# Patient Record
Sex: Female | Born: 1965 | Race: Black or African American | Hispanic: No | Marital: Single | Smoking: Never smoker
Health system: Southern US, Community
[De-identification: ages and names within clinical notes are randomized; demographics above are authoritative.]

## PROBLEM LIST (undated history)

## (undated) DIAGNOSIS — F78 Other intellectual disabilities: Secondary | ICD-10-CM

## (undated) DIAGNOSIS — I10 Essential (primary) hypertension: Secondary | ICD-10-CM

## (undated) DIAGNOSIS — J40 Bronchitis, not specified as acute or chronic: Secondary | ICD-10-CM

## (undated) DIAGNOSIS — Z789 Other specified health status: Secondary | ICD-10-CM

## (undated) DIAGNOSIS — F79 Unspecified intellectual disabilities: Secondary | ICD-10-CM

## (undated) DIAGNOSIS — E119 Type 2 diabetes mellitus without complications: Secondary | ICD-10-CM

## (undated) DIAGNOSIS — F819 Developmental disorder of scholastic skills, unspecified: Secondary | ICD-10-CM

## (undated) HISTORY — DX: Bronchitis, not specified as acute or chronic: J40

## (undated) HISTORY — PX: OTHER SURGICAL HISTORY: SHX169

## (undated) HISTORY — DX: Essential (primary) hypertension: I10

## (undated) HISTORY — DX: Unspecified intellectual disabilities: F79

## (undated) HISTORY — DX: Type 2 diabetes mellitus without complications: E11.9

## (undated) HISTORY — DX: Other intellectual disabilities: F78

---

## 2006-02-26 ENCOUNTER — Ambulatory Visit (HOSPITAL_COMMUNITY): Admission: RE | Admit: 2006-02-26 | Discharge: 2006-02-26 | Payer: Self-pay | Admitting: Internal Medicine

## 2007-03-25 ENCOUNTER — Ambulatory Visit (HOSPITAL_COMMUNITY): Admission: RE | Admit: 2007-03-25 | Discharge: 2007-03-25 | Payer: Self-pay | Admitting: Internal Medicine

## 2009-02-05 ENCOUNTER — Ambulatory Visit (HOSPITAL_COMMUNITY): Admission: RE | Admit: 2009-02-05 | Discharge: 2009-02-05 | Payer: Self-pay | Admitting: Internal Medicine

## 2010-08-04 ENCOUNTER — Encounter: Payer: Self-pay | Admitting: Internal Medicine

## 2012-12-13 ENCOUNTER — Other Ambulatory Visit (HOSPITAL_COMMUNITY): Payer: Self-pay | Admitting: Internal Medicine

## 2012-12-13 DIAGNOSIS — Z139 Encounter for screening, unspecified: Secondary | ICD-10-CM

## 2012-12-20 ENCOUNTER — Ambulatory Visit (HOSPITAL_COMMUNITY): Payer: Self-pay

## 2013-02-25 ENCOUNTER — Ambulatory Visit (HOSPITAL_COMMUNITY)
Admission: RE | Admit: 2013-02-25 | Discharge: 2013-02-25 | Disposition: A | Discharge status: Home / Self Care | Payer: Medicaid Other | Source: Ambulatory Visit | Attending: Internal Medicine | Admitting: Internal Medicine

## 2013-02-25 DIAGNOSIS — Z1231 Encounter for screening mammogram for malignant neoplasm of breast: Secondary | ICD-10-CM | POA: Insufficient documentation

## 2013-02-25 DIAGNOSIS — Z139 Encounter for screening, unspecified: Secondary | ICD-10-CM

## 2013-12-12 ENCOUNTER — Other Ambulatory Visit (HOSPITAL_COMMUNITY): Payer: Self-pay | Admitting: Internal Medicine

## 2013-12-12 ENCOUNTER — Ambulatory Visit (HOSPITAL_COMMUNITY)
Admission: RE | Admit: 2013-12-12 | Discharge: 2013-12-12 | Disposition: A | Discharge status: Home / Self Care | Payer: Medicaid Other | Source: Ambulatory Visit | Attending: Internal Medicine | Admitting: Internal Medicine

## 2013-12-12 DIAGNOSIS — R059 Cough, unspecified: Secondary | ICD-10-CM

## 2013-12-12 DIAGNOSIS — R05 Cough: Secondary | ICD-10-CM

## 2015-08-22 ENCOUNTER — Telehealth: Payer: Self-pay

## 2015-08-22 NOTE — Telephone Encounter (Signed)
[phone]  PATIENT RECEIVED LETTER TO SCHEDULE TCS

## 2015-08-27 NOTE — Telephone Encounter (Signed)
LM for pt to call

## 2015-08-30 NOTE — Telephone Encounter (Signed)
LMOM to call.

## 2015-09-06 NOTE — Telephone Encounter (Signed)
Pt has appt on 09/21/2015 at 10:30 Am with Gerrit Halls, NP for diarrhea prior to scheduling colonoscopy.

## 2015-09-21 ENCOUNTER — Ambulatory Visit (INDEPENDENT_AMBULATORY_CARE_PROVIDER_SITE_OTHER): Payer: Medicaid Other | Admitting: Gastroenterology

## 2015-09-21 ENCOUNTER — Other Ambulatory Visit: Payer: Self-pay

## 2015-09-21 ENCOUNTER — Encounter: Payer: Self-pay | Admitting: Gastroenterology

## 2015-09-21 VITALS — BP 144/84 | HR 98 | Temp 98.1°F | Ht 61.0 in | Wt 246.0 lb

## 2015-09-21 DIAGNOSIS — Z1211 Encounter for screening for malignant neoplasm of colon: Secondary | ICD-10-CM | POA: Diagnosis not present

## 2015-09-21 DIAGNOSIS — Z800 Family history of malignant neoplasm of digestive organs: Secondary | ICD-10-CM | POA: Insufficient documentation

## 2015-09-21 MED ORDER — NA SULFATE-K SULFATE-MG SULF 17.5-3.13-1.6 GM/177ML PO SOLN: 1.0000 | Freq: Once | ORAL | Status: DC

## 2015-09-21 NOTE — Progress Notes (Signed)
Primary Care Physician:  Avon GullyFANTA,TESFAYE, MD Primary Gastroenterologist:  Dr. Darrick PennaFields,   Chief Complaint  Patient presents with  . Diarrhea  . Colonoscopy    HPI:   Terri Bauer is a 50 y.o. female presenting today at the request of Dr. Felecia ShellingFanta for screening colonoscopy. She reported diarrhea when being triaged, so she has been brought in for further evaluation.   She states she has had diarrhea for the past 2 years. No abdominal discomfort. 3-4 bowel movements a day. Usually postprandial. Points to [facility] #1, then 7, then #4. Points to a different one each time and describes stool differently each time she is asked. No rectal bleeding. Purposefully trying to lose weight and has lost 5 lbs. No upper GI symptoms. She is a poor historian, with notes indicating "intellectual disabilities".   Past Medical History  Diagnosis Date  . Diabetes (HCC)   . Bronchitis   . Hypertension   . Other intellectual disabilities     sees psychiatrist    Past Surgical History  Procedure Laterality Date  . None      Current Outpatient Prescriptions  Medication Sig Dispense Refill  . amLODipine (NORVASC) 10 MG tablet Take 10 mg by mouth daily.    . benztropine (COGENTIN) 2 MG tablet Take 2 mg by mouth daily.    . ferrous sulfate 325 (65 FE) MG tablet Take 325 mg by mouth 2 (two) times daily with a meal.    . haloperidol (HALDOL) 5 MG tablet Take 5 mg by mouth at bedtime.    Marland Kitchen. losartan-hydrochlorothiazide (HYZAAR) 50-12.5 MG tablet Take 1 tablet by mouth daily.    . metFORMIN (GLUCOPHAGE) 500 MG tablet Take 500 mg by mouth 2 (two) times daily with a meal.     No current facility-administered medications for this visit.    Allergies as of 09/21/2015  . (No Known Allergies)    Family History  Problem Relation Age of Onset  . Colon cancer Father     diagnosed in his 6360s, deceased     Social History   Social History  . Marital Status: Single    Spouse Name: N/A  . Number of  Children: N/A  . Years of Education: N/A   Occupational History  . Not on file.   Social History Main Topics  . Smoking status: Never Smoker   . Smokeless tobacco: Not on file  . Alcohol Use: No  . Drug Use: No  . Sexual Activity: Not on file   Other Topics Concern  . Not on file   Social History Narrative   Lives with mother. Has a psychiatrist and states "I don't know why I see them".     Review of Systems: Gen: Denies any fever, chills, fatigue, weight loss, lack of appetite.  CV: Denies chest pain, heart palpitations, peripheral edema, syncope.  Resp: Denies shortness of breath at rest or with exertion. Denies wheezing or cough.  GI: see HPI  GU : Denies urinary burning, urinary frequency, urinary hesitancy MS: Denies joint pain, muscle weakness, cramps, or limitation of movement.  Derm: Denies rash, itching, dry skin Psych: Denies depression, anxiety, memory loss, and confusion Heme: Denies bruising, bleeding, and enlarged lymph nodes.  Physical Exam: BP 144/84 mmHg  Pulse 98  Temp(Src) 98.1 F (36.7 C) (Oral)  Ht 5\' 1"  (1.549 m)  Wt 246 lb (111.585 kg)  BMI 46.51 kg/m2 General:   Alert and oriented. Somewhat slow to respond but accurately answers  questions. Well-nourished and well-developed.  Head:  Normocephalic and atraumatic. Eyes:  Without icterus, sclera clear and conjunctiva pink.  Ears:  Normal auditory acuity. Nose:  No deformity, discharge,  or lesions. Mouth:  No deformity or lesions, oral mucosa pink.  Lungs:  Clear to auscultation bilaterally. No wheezes, rales, or rhonchi. No distress.  Heart:  S1, S2 present without murmurs appreciated.  Abdomen:  +BS, soft, non-tender and non-distended. No HSM noted. No guarding or rebound. No masses appreciated.  Rectal:  Deferred  Msk:  Symmetrical without gross deformities. Normal posture. Extremities:  Without edema. Neurologic:  Alert and  oriented x4;  grossly normal neurologically. Psych:  Alert and  cooperative. Normal mood and affect.

## 2015-09-21 NOTE — Assessment & Plan Note (Signed)
50 year old female presenting today for first ever screening colonoscopy, reporting a positive family history of colon cancer in her father, diagnosed in his 2160s. She is a limited historian, with referring notes stating "intellectual disabilities". It is not clear exactly her diagnosis, but she is conversant and appropriately responds to questions but is not able to give a clear report of her bowel habits. From her description, she seems to possibly have several bowel movements a day but points to different examples of this on the [facility] scale, each inconsistent with one another. From talking with her more, I feel she may not be having diarrhea per se but more frequent bowel movements and actually may have some periods of incomplete evacuation and hard stool. At this time, it is entirely unclear. I have recommended adding supplemental fiber daily and proceeding with a screening colonoscopy. If there is concern for diarrhea, would recommend discontinuing metformin and changing this to another medication if possible, as this could very well be a classic side effect.   Proceed with colonoscopy with Dr. Darrick PennaFields in the near future. The risks, benefits, and alternatives have been discussed in detail with the patient. They state understanding and desire to proceed.  PROPOFOL due to polypharmacy

## 2015-09-21 NOTE — Patient Instructions (Addendum)
Start taking extra fiber every day such as Metamucil or Benefiber. This is over the counter.  Do not take the metformin the day of your procedure.   We have scheduled you for a colonoscopy with Dr. Darrick PennaFields.

## 2015-09-24 NOTE — Progress Notes (Signed)
cc'ed to pcp °

## 2015-09-28 ENCOUNTER — Telehealth: Payer: Self-pay | Admitting: General Practice

## 2015-09-28 NOTE — Telephone Encounter (Signed)
I asked the patient several times to write the dates for her appointment on her calendar, however she keep iterating to me she understood her appointments.

## 2015-09-28 NOTE — Telephone Encounter (Signed)
Patient called in asking when she should take her bowel prep.  I went over her instructions with her and her mom and they both voiced understanding.

## 2015-10-04 NOTE — Patient Instructions (Signed)
Lisette GrinderSabrina Ceci  10/04/2015     @PREFPERIOPPHARMACY @   Your procedure is scheduled on  10/09/2015   [facility] at  915  A.M.  Call this number if you have problems the morning of surgery:  [phone]   Remember:  Do not eat food or drink liquids after midnight.  Take these medicines the morning of surgery with A SIP OF WATER  Norvasc, cogentin, losartan.   Do not wear jewelry, make-up or nail polish.  Do not wear lotions, powders, or perfumes.  You may wear deodorant.  Do not shave 48 hours prior to surgery.  Men may shave face and neck.  Do not bring valuables to the hospital.  [facility] is not responsible for any belongings or valuables.  Contacts, dentures or bridgework may not be worn into surgery.  Leave your suitcase in the car.  After surgery it may be brought to your room.  For patients admitted to the hospital, discharge time will be determined by your treatment team.  Patients discharged the day of surgery will not be allowed to drive home.   Name and phone number of your driver:   family Special instructions: Follow the diet and prep instructions given to you by Dr Evelina DunField's office.  Please read over the following fact sheets that you were given. Coughing and Deep Breathing, Surgical Site Infection Prevention, Anesthesia Post-op Instructions and Care and Recovery After Surgery      Colonoscopy A colonoscopy is an exam to look at the entire large intestine (colon). This exam can help find problems such as tumors, polyps, inflammation, and areas of bleeding. The exam takes about 1 hour.  LET [facility] KNOW ABOUT:   Any allergies you have.  All medicines you are taking, including vitamins, herbs, eye drops, creams, and over-the-counter medicines.  Previous problems you or members of your family have had with the use of anesthetics.  Any blood disorders you have.  Previous surgeries you have had.  Medical conditions  you have. RISKS AND COMPLICATIONS  Generally, this is a safe procedure. However, as with any procedure, complications can occur. Possible complications include:  Bleeding.  Tearing or rupture of the colon wall.  Reaction to medicines given during the exam.  Infection (rare). BEFORE THE PROCEDURE   Ask your health care provider about changing or stopping your regular medicines.  You may be prescribed an oral bowel prep. This involves drinking a large amount of medicated liquid, starting the day before your procedure. The liquid will cause you to have multiple loose stools until your stool is almost clear or light green. This cleans out your colon in preparation for the procedure.  Do not eat or drink anything else once you have started the bowel prep, unless your health care provider tells you it is safe to do so.  Arrange for someone to drive you home after the procedure. PROCEDURE   You will be given medicine to help you relax (sedative).  You will lie on your side with your knees bent.  A long, flexible tube with a light and camera on the end (colonoscope) will be inserted through the rectum and into the colon. The camera sends video back to a computer screen as it moves through the colon. The colonoscope also releases carbon dioxide gas to inflate the colon. This helps your health care provider see the area better.  During the exam, your health care provider  may take a small tissue sample (biopsy) to be examined under a microscope if any abnormalities are found.  The exam is finished when the entire colon has been viewed. AFTER THE PROCEDURE   Do not drive for 24 hours after the exam.  You may have a small amount of blood in your stool.  You may pass moderate amounts of gas and have mild abdominal cramping or bloating. This is caused by the gas used to inflate your colon during the exam.  Ask when your test results will be ready and how you will get your results. Make sure  you get your test results.   This information is not intended to replace advice given to you by your health care provider. Make sure you discuss any questions you have with your health care provider.   Document Released: 06/27/2000 Document Revised: 04/20/2013 Document Reviewed: 03/07/2013 Elsevier Interactive Patient Education 2016 [employer] Colonoscopy, Care After Refer to this sheet in the next few weeks. These instructions provide you with information on caring for yourself after your procedure. Your health care provider may also give you more specific instructions. Your treatment has been planned according to current medical practices, but problems sometimes occur. Call your health care provider if you have any problems or questions after your procedure. WHAT TO EXPECT AFTER THE PROCEDURE  After your procedure, it is typical to have the following:  A small amount of blood in your stool.  Moderate amounts of gas and mild abdominal cramping or bloating. HOME CARE INSTRUCTIONS  Do not drive, operate machinery, or sign important documents for 24 hours.  You may shower and resume your regular physical activities, but move at a slower pace for the first 24 hours.  Take frequent rest periods for the first 24 hours.  Walk around or put a warm pack on your abdomen to help reduce abdominal cramping and bloating.  Drink enough fluids to keep your urine clear or pale yellow.  You may resume your normal diet as instructed by your health care provider. Avoid heavy or fried foods that are hard to digest.  Avoid drinking alcohol for 24 hours or as instructed by your health care provider.  Only take over-the-counter or prescription medicines as directed by your health care provider.  If a tissue sample (biopsy) was taken during your procedure:  Do not take aspirin or blood thinners for 7 days, or as instructed by your health care provider.  Do not drink alcohol for 7 days, or as  instructed by your health care provider.  Eat soft foods for the first 24 hours. SEEK MEDICAL CARE IF: You have persistent spotting of blood in your stool 2-3 days after the procedure. SEEK IMMEDIATE MEDICAL CARE IF:  You have more than a small spotting of blood in your stool.  You pass large blood clots in your stool.  Your abdomen is swollen (distended).  You have nausea or vomiting.  You have a fever.  You have increasing abdominal pain that is not relieved with medicine.   This information is not intended to replace advice given to you by your health care provider. Make sure you discuss any questions you have with your health care provider.   Document Released: 02/12/2004 Document Revised: 04/20/2013 Document Reviewed: 03/07/2013 Elsevier Interactive Patient Education 2016 [employer] PATIENT INSTRUCTIONS POST-ANESTHESIA  IMMEDIATELY FOLLOWING SURGERY:  Do not drive or operate machinery for the first twenty four hours after surgery.  Do not make any important decisions for twenty  four hours after surgery or while taking narcotic pain medications or sedatives.  If you develop intractable nausea and vomiting or a severe headache please notify your doctor immediately.  FOLLOW-UP:  Please make an appointment with your surgeon as instructed. You do not need to follow up with anesthesia unless specifically instructed to do so.  WOUND CARE INSTRUCTIONS (if applicable):  Keep a dry clean dressing on the anesthesia/puncture wound site if there is drainage.  Once the wound has quit draining you may leave it open to air.  Generally you should leave the bandage intact for twenty four hours unless there is drainage.  If the epidural site drains for more than 36-48 hours please call the anesthesia department.  QUESTIONS?:  Please feel free to call your physician or the hospital operator if you have any questions, and they will be happy to assist you.

## 2015-10-05 ENCOUNTER — Telehealth: Payer: Self-pay

## 2015-10-05 ENCOUNTER — Encounter (HOSPITAL_COMMUNITY): Payer: Self-pay

## 2015-10-05 ENCOUNTER — Encounter (HOSPITAL_COMMUNITY)
Admission: RE | Admit: 2015-10-05 | Discharge: 2015-10-05 | Disposition: A | Discharge status: Home / Self Care | Payer: Medicaid Other | Source: Ambulatory Visit | Attending: Gastroenterology | Admitting: Gastroenterology

## 2015-10-05 DIAGNOSIS — Z01810 Encounter for preprocedural cardiovascular examination: Secondary | ICD-10-CM | POA: Insufficient documentation

## 2015-10-05 DIAGNOSIS — Z01812 Encounter for preprocedural laboratory examination: Secondary | ICD-10-CM | POA: Insufficient documentation

## 2015-10-05 HISTORY — DX: Other specified health status: Z78.9

## 2015-10-05 HISTORY — DX: Developmental disorder of scholastic skills, unspecified: F81.9

## 2015-10-05 LAB — BASIC METABOLIC PANEL
ANION GAP: 8 (ref 5–15)
BUN: 9 mg/dL (ref 6–20)
CALCIUM: 9 mg/dL (ref 8.9–10.3)
CO2: 30 mmol/L (ref 22–32)
Chloride: 100 mmol/L — ABNORMAL LOW (ref 101–111)
Creatinine, Ser: 0.95 mg/dL (ref 0.44–1.00)
GFR calc Af Amer: >60 mL/min (ref >60)
GFR calc non Af Amer: >60 mL/min (ref >60)
GLUCOSE: 88 mg/dL (ref 65–99)
POTASSIUM: 3.5 mmol/L (ref 3.5–5.1)
Sodium: 138 mmol/L (ref 135–145)

## 2015-10-05 LAB — CBC
HEMATOCRIT: 35.7 % — AB (ref 36.0–46.0)
Hemoglobin: 11.7 g/dL — ABNORMAL LOW (ref 12.0–15.0)
MCH: 32.9 pg (ref 26.0–34.0)
MCHC: 32.8 g/dL (ref 30.0–36.0)
MCV: 100.3 fL — AB (ref 78.0–100.0)
Platelets: 255 10*3/uL (ref 150–400)
RBC: 3.56 MIL/uL — ABNORMAL LOW (ref 3.87–5.11)
RDW: 13.9 % (ref 11.5–15.5)
WBC: 8.3 10*3/uL (ref 4.0–10.5)

## 2015-10-05 NOTE — Telephone Encounter (Signed)
Talked with patient to move up her time from 10:45 to 8:45. She is aware

## 2015-10-09 ENCOUNTER — Encounter (HOSPITAL_COMMUNITY): Payer: Self-pay | Admitting: *Deleted

## 2015-10-09 ENCOUNTER — Encounter (HOSPITAL_COMMUNITY)
Admission: RE | Disposition: A | Discharge status: Home / Self Care | Payer: Self-pay | Source: Ambulatory Visit | Attending: Gastroenterology

## 2015-10-09 ENCOUNTER — Other Ambulatory Visit: Payer: Self-pay

## 2015-10-09 ENCOUNTER — Ambulatory Visit (HOSPITAL_COMMUNITY)
Admission: RE | Admit: 2015-10-09 | Discharge: 2015-10-09 | Disposition: A | Discharge status: Home / Self Care | Payer: Medicaid Other | Source: Ambulatory Visit | Attending: Gastroenterology | Admitting: Gastroenterology

## 2015-10-09 ENCOUNTER — Ambulatory Visit (HOSPITAL_COMMUNITY): Payer: Medicaid Other | Admitting: Anesthesiology

## 2015-10-09 DIAGNOSIS — E119 Type 2 diabetes mellitus without complications: Secondary | ICD-10-CM | POA: Diagnosis not present

## 2015-10-09 DIAGNOSIS — K648 Other hemorrhoids: Secondary | ICD-10-CM | POA: Insufficient documentation

## 2015-10-09 DIAGNOSIS — B9681 Helicobacter pylori [H. pylori] as the cause of diseases classified elsewhere: Secondary | ICD-10-CM | POA: Insufficient documentation

## 2015-10-09 DIAGNOSIS — Z7984 Long term (current) use of oral hypoglycemic drugs: Secondary | ICD-10-CM | POA: Diagnosis not present

## 2015-10-09 DIAGNOSIS — D509 Iron deficiency anemia, unspecified: Secondary | ICD-10-CM | POA: Diagnosis not present

## 2015-10-09 DIAGNOSIS — K2970 Gastritis, unspecified, without bleeding: Secondary | ICD-10-CM

## 2015-10-09 DIAGNOSIS — Q438 Other specified congenital malformations of intestine: Secondary | ICD-10-CM | POA: Insufficient documentation

## 2015-10-09 DIAGNOSIS — Z79899 Other long term (current) drug therapy: Secondary | ICD-10-CM | POA: Insufficient documentation

## 2015-10-09 DIAGNOSIS — I10 Essential (primary) hypertension: Secondary | ICD-10-CM | POA: Diagnosis not present

## 2015-10-09 DIAGNOSIS — D649 Anemia, unspecified: Secondary | ICD-10-CM | POA: Insufficient documentation

## 2015-10-09 DIAGNOSIS — D539 Nutritional anemia, unspecified: Secondary | ICD-10-CM

## 2015-10-09 HISTORY — PX: COLONOSCOPY WITH PROPOFOL: SHX5780

## 2015-10-09 LAB — GLUCOSE, CAPILLARY
GLUCOSE-CAPILLARY: 98 mg/dL (ref 65–99)
GLUCOSE-CAPILLARY: 101 mg/dL — AB (ref 65–99)

## 2015-10-09 LAB — VITAMIN B12: Vitamin B-12: 154 pg/mL — ABNORMAL LOW (ref 180–914)

## 2015-10-09 SURGERY — COLONOSCOPY WITH PROPOFOL
Anesthesia: Monitor Anesthesia Care

## 2015-10-09 MED ORDER — LACTATED RINGERS IV SOLN
INTRAVENOUS | Status: DC
  Administered 2015-10-09: 08:00:00 via INTRAVENOUS

## 2015-10-09 MED ORDER — ONDANSETRON HCL 4 MG/2ML IJ SOLN: 4.0000 mg | Freq: Once | INTRAMUSCULAR | Status: DC | PRN

## 2015-10-09 MED ORDER — FENTANYL CITRATE (PF) 100 MCG/2ML IJ SOLN
INTRAMUSCULAR | Status: AC
  Filled 2015-10-09: qty 2

## 2015-10-09 MED ORDER — MIDAZOLAM HCL 5 MG/5ML IJ SOLN
INTRAMUSCULAR | Status: DC | PRN
  Administered 2015-10-09: 2 mg via INTRAVENOUS

## 2015-10-09 MED ORDER — FENTANYL CITRATE (PF) 100 MCG/2ML IJ SOLN: 25.0000 ug | INTRAMUSCULAR | Status: DC | PRN

## 2015-10-09 MED ORDER — LIDOCAINE VISCOUS 2 % MT SOLN
15.0000 mL | Freq: Once | OROMUCOSAL | Status: AC
  Administered 2015-10-09: 15 mL via OROMUCOSAL

## 2015-10-09 MED ORDER — PROPOFOL 10 MG/ML IV BOLUS
INTRAVENOUS | Status: AC
  Filled 2015-10-09: qty 20

## 2015-10-09 MED ORDER — ONDANSETRON HCL 4 MG/2ML IJ SOLN
4.0000 mg | Freq: Once | INTRAMUSCULAR | Status: AC
  Administered 2015-10-09: 4 mg via INTRAVENOUS

## 2015-10-09 MED ORDER — ONDANSETRON HCL 4 MG/2ML IJ SOLN
INTRAMUSCULAR | Status: AC
  Filled 2015-10-09: qty 2

## 2015-10-09 MED ORDER — PROPOFOL 500 MG/50ML IV EMUL
INTRAVENOUS | Status: DC | PRN
  Administered 2015-10-09: 75 ug/kg/min via INTRAVENOUS
  Administered 2015-10-09: 09:00:00 via INTRAVENOUS

## 2015-10-09 MED ORDER — MIDAZOLAM HCL 2 MG/2ML IJ SOLN
INTRAMUSCULAR | Status: AC
  Filled 2015-10-09: qty 2

## 2015-10-09 MED ORDER — GLYCOPYRROLATE 0.2 MG/ML IJ SOLN
0.2000 mg | Freq: Once | INTRAMUSCULAR | Status: AC
  Administered 2015-10-09: 0.2 mg via INTRAVENOUS
  Filled 2015-10-09: qty 1

## 2015-10-09 MED ORDER — FENTANYL CITRATE (PF) 100 MCG/2ML IJ SOLN
25.0000 ug | INTRAMUSCULAR | Status: AC
  Administered 2015-10-09: 25 ug via INTRAVENOUS

## 2015-10-09 MED ORDER — MIDAZOLAM HCL 2 MG/2ML IJ SOLN
1.0000 mg | INTRAMUSCULAR | Status: DC | PRN
  Administered 2015-10-09: 2 mg via INTRAVENOUS
  Filled 2015-10-09: qty 2

## 2015-10-09 MED ORDER — LIDOCAINE VISCOUS 2 % MT SOLN
OROMUCOSAL | Status: AC
  Filled 2015-10-09: qty 15

## 2015-10-09 NOTE — Anesthesia Postprocedure Evaluation (Signed)
Anesthesia Post Note  Patient: Terri Bauer  Procedure(s) Performed: Procedure(s) (LRB): COLONOSCOPY WITH PROPOFOL (N/A)  Patient location during evaluation: PACU Anesthesia Type: MAC Level of consciousness: awake and patient cooperative Pain management: pain level controlled Vital Signs Assessment: post-procedure vital signs reviewed and stable Respiratory status: spontaneous breathing, nonlabored ventilation and patient connected to nasal cannula oxygen Cardiovascular status: stable Postop Assessment: no signs of nausea or vomiting Anesthetic complications: no    Last Vitals:  Filed Vitals:   10/09/15 0840 10/09/15 0845  BP: 111/69   Temp:    Resp: 16 25    Last Pain: There were no vitals filed for this visit.               Sharde Gover J

## 2015-10-09 NOTE — Op Note (Signed)
[facility] Patient Name: Terri Bauer Procedure Date: 10/09/2015 9:36 AM MRN: [id number] Date of Birth: [birth year] Attending MD: Terri Bauer , MD CSN: [id number] Age: 50 Admit Type: Outpatient Procedure:                Upper GI endoscopy Indications:              Suspected upper gastrointestinal bleeding in                            patient with unexplained iron deficiency anemia Providers:                Terri Eva, MD, Brain Hilts, RN, Calton Dach,                            Technician Referring MD:             [name] (Referring MD) Medicines:                Propofol per Anesthesia Complications:            No immediate complications. Estimated Blood Loss:     Estimated blood loss was minimal. Procedure:                Pre-Anesthesia Assessment:                           - Prior to the procedure, a History and Physical                            was performed, and patient medications and                            allergies were reviewed. The patient's tolerance of                            previous anesthesia was also reviewed. The risks                            and benefits of the procedure and the sedation                            options and risks were discussed with the patient.                            All questions were answered, and informed consent                            was obtained. Prior Anticoagulants: The patient has                            taken no previous anticoagulant or antiplatelet                            agents. ASA Grade Assessment: II - A patient with  mild systemic disease. After reviewing the risks                            and benefits, the patient was deemed in                            satisfactory condition to undergo the procedure.                           After obtaining informed consent, the endoscope was                            passed under direct vision. Throughout the                      procedure, the patient's blood pressure, pulse, and                            oxygen saturations were monitored continuously. The                            [id number]) was introduced through the                            mouth, and advanced to the second part of duodenum.                            The upper GI endoscopy was accomplished without                            difficulty. Scope In: 9:34:05 AM Scope Out: 9:42:20 AM Total Procedure Duration: 0 hours 8 minutes 15 seconds  Findings:      The examined esophagus was normal.      Scattered mild inflammation characterized by erythema was found in the       gastric antrum. Biopsies were taken with a cold forceps for Helicobacter       pylori testing.      The duodenal bulb and second portion of the duodenum were normal. Impression:               - Normal esophagus.                           - Gastritis. Biopsied.                           - Normal duodenal bulb and second portion of the                            duodenum. Moderate Sedation:      Per Anesthesia Care Recommendation:           - High fiber diet and low fat diet.                           - Continue present medications.                           -  Await pathology results.                           CHECK VITAMIN B12 LEVEL TODAY.                           AVOID ITEMS THAT TRIGGER GASTRITIS. HANDOUT GIVEN.                           CONSIDER GIVENS ENDOSCOPY.                           NEXT COLONOSCOPY IN 10 YEARS.                           **ADDITONAL IMAGES UNDER TCS REPORT**                           - Return to normal activities tomorrow. Procedure Code(s):        --- Professional ---                           [phone], Esophagogastroduodenoscopy, flexible,                            transoral; with biopsy, single or multiple Diagnosis Code(s):        --- Professional ---                           K29.70, Gastritis, unspecified, without  bleeding                           D50.9, Iron deficiency anemia, unspecified CPT copyright 2016 American Medical Association. All rights reserved. The codes documented in this report are preliminary and upon coder review may  be revised to meet current compliance requirements. Terri Eva, MD Terri Eva, MD 10/09/2015 11:29:34 AM This report has been signed electronically. Number of Addenda: 0

## 2015-10-09 NOTE — Anesthesia Preprocedure Evaluation (Signed)
Anesthesia Evaluation  Patient identified by MRN, date of birth, ID band Patient awake    Reviewed: Allergy & Precautions, NPO status , Patient's Chart, lab work & pertinent test results  Airway Mallampati: III  TM Distance: >3 FB     Dental  (+) Poor Dentition, Chipped, Missing   Pulmonary neg pulmonary ROS,    breath sounds clear to auscultation       Cardiovascular hypertension, Pt. on medications  Rhythm:Regular Rate:Normal     Neuro/Psych intellectual disabilities   GI/Hepatic   Endo/Other  diabetes, Type 2, Oral Hypoglycemic AgentsMorbid obesity  Renal/GU      Musculoskeletal   Abdominal   Peds  Hematology   Anesthesia Other Findings   Reproductive/Obstetrics                             Anesthesia Physical Anesthesia Plan  ASA: III  Anesthesia Plan: MAC   Post-op Pain Management:    Induction: Intravenous  Airway Management Planned: Simple Face Mask  Additional Equipment:   Intra-op Plan:   Post-operative Plan:   Informed Consent: I have reviewed the patients History and Physical, chart, labs and discussed the procedure including the risks, benefits and alternatives for the proposed anesthesia with the patient or authorized representative who has indicated his/her understanding and acceptance.     Plan Discussed with:   Anesthesia Plan Comments:         Anesthesia Quick Evaluation

## 2015-10-09 NOTE — Discharge Instructions (Signed)
You have internal hemorrhoids. You have gastritis. I BIOPSIED YOUR STOMACH and small bowel.    I CHECKED YOUR  VITAMIN B12 LEVEL TODAY.  FOLLOW A LOW FAT/HIGH FIBER DIET. AVOID ITEMS THAT CAUSE BLOATING. SEE INFO BELOW.  AVOID ITEMS THAT TRIGGER GASTRITIS. SEE INFO BELOW  YOUR BIOPSY RESULTS WILL BE AVAILABLE IN MY CHART MAR 31 AND MY OFFICE WILL CONTACT YOU IN 10-14 DAYS WITH YOUR RESULTS.   NEXT COLONOSCOPY IN 10 YEARS.   ENDOSCOPY Care After Read the instructions outlined below and refer to this sheet in the next week. These discharge instructions provide you with general information on caring for yourself after you leave the hospital. While your treatment has been planned according to the most current medical practices available, unavoidable complications occasionally occur. If you have any problems or questions after discharge, call DR. Izzie Geers, [phone].  ACTIVITY  You may resume your regular activity, but move at a slower pace for the next 24 hours.   Take frequent rest periods for the next 24 hours.   Walking will help get rid of the air and reduce the bloated feeling in your belly (abdomen).   No driving for 24 hours (because of the medicine (anesthesia) used during the test).   You may shower.   Do not sign any important legal documents or operate any machinery for 24 hours (because of the anesthesia used during the test).    NUTRITION  Drink plenty of fluids.   You may resume your normal diet as instructed by your doctor.   Begin with a light meal and progress to your normal diet. Heavy or fried foods are harder to digest and may make you feel sick to your stomach (nauseated).   Avoid alcoholic beverages for 24 hours or as instructed.    MEDICATIONS  You may resume your normal medications.   WHAT YOU CAN EXPECT TODAY  Some feelings of bloating in the abdomen.   Passage of more gas than usual.   Spotting of blood in your stool or on the toilet  paper  .  IF YOU HAD POLYPS REMOVED DURING THE ENDOSCOPY:  Eat a soft diet IF YOU HAVE NAUSEA, BLOATING, ABDOMINAL PAIN, OR VOMITING.    FINDING OUT THE RESULTS OF YOUR TEST Not all test results are available during your visit. DR. Darrick Penna WILL CALL YOU WITHIN 14 DAYS OF YOUR PROCEDUE WITH YOUR RESULTS. Do not assume everything is normal if you have not heard from DR. Levorn Oleski, CALL HER OFFICE AT [phone].  SEEK IMMEDIATE MEDICAL ATTENTION AND CALL THE OFFICE: [phone] IF:  You have more than a spotting of blood in your stool.   Your belly is swollen (abdominal distention).   You are nauseated or vomiting.   You have a temperature over 101F.   You have abdominal pain or discomfort that is severe or gets worse throughout the day.   Gastritis  Gastritis is an inflammation (the body's way of reacting to injury and/or infection) of the stomach. It is often caused by bacterial (germ) infections. It can also be caused BY ASPIRIN, BC/GOODY POWDER'S, (IBUPROFEN) MOTRIN, OR ALEVE (NAPROXEN), chemicals (including alcohol), SPICY FOODS, and medications. This illness may be associated with generalized malaise (feeling tired, not well), UPPER ABDOMINAL STOMACH cramps, and fever. One common bacterial cause of gastritis is an organism known as H. Pylori. This can be treated with antibiotics.    High-Fiber Diet A high-fiber diet changes your normal diet to include more whole grains, legumes, fruits, and  vegetables. Changes in the diet involve replacing refined carbohydrates with unrefined foods. The calorie level of the diet is essentially unchanged. The Dietary Reference Intake (recommended amount) for adult males is 38 grams per day. For adult females, it is 25 grams per day. Pregnant and lactating women should consume 28 grams of fiber per day. Fiber is the intact part of a plant that is not broken down during digestion. Functional fiber is fiber that has been isolated from the plant to  provide a beneficial effect in the body. PURPOSE  Increase stool bulk.   Ease and regulate bowel movements.   Lower cholesterol.  INDICATIONS THAT YOU NEED MORE FIBER  Constipation and hemorrhoids.   Uncomplicated diverticulosis (intestine condition) and irritable bowel syndrome.   Weight management.   As a protective measure against hardening of the arteries (atherosclerosis), diabetes, and cancer.   DO NOT USE WITH:  Acute diverticulitis (intestine infection).   Partial small bowel obstructions.   Complicated diverticular disease involving bleeding, rupture (perforation), or abscess (boil, furuncle).   Presence of autonomic neuropathy (nerve damage) or gastroparesis (stomach cannot empty itself).    GUIDELINES FOR INCREASING FIBER IN THE DIET  Start adding fiber to the diet slowly. A gradual increase of about 5 more grams (2 slices of whole-wheat bread, 2 servings of most fruits or vegetables, or 1 bowl of high-fiber cereal) per day is best. Too rapid an increase in fiber may result in constipation, flatulence, and bloating.   Drink enough water and fluids to keep your urine clear or pale yellow. Water, juice, or caffeine-free drinks are recommended. Not drinking enough fluid may cause constipation.   Eat a variety of high-fiber foods rather than one type of fiber.   Try to increase your intake of fiber through using high-fiber foods rather than fiber pills or supplements that contain small amounts of fiber.   The goal is to change the types of food eaten. Do not supplement your present diet with high-fiber foods, but replace foods in your present diet.    INCLUDE A VARIETY OF FIBER SOURCES  Replace refined and processed grains with whole grains, canned fruits with fresh fruits, and incorporate other fiber sources. White rice, white breads, and most bakery goods contain little or no fiber.   Brown whole-grain rice, buckwheat oats, and many fruits and vegetables are all  good sources of fiber. These include: broccoli, Brussels sprouts, cabbage, cauliflower, beets, sweet potatoes, white potatoes (skin on), carrots, tomatoes, eggplant, squash, berries, fresh fruits, and dried fruits.   Cereals appear to be the richest source of fiber. Cereal fiber is found in whole grains and bran. Bran is the fiber-rich outer coat of cereal grain, which is largely removed in refining. In whole-grain cereals, the bran remains. In breakfast cereals, the largest amount of fiber is found in those with "bran" in their names. The fiber content is sometimes indicated on the label.   You may need to include additional fruits and vegetables each day.   In baking, for 1 cup white flour, you may use the following substitutions:   1 cup whole-wheat flour minus 2 tablespoons.   1/2 cup white flour plus 1/2 cup whole-wheat flour.   Low-Fat Diet BREADS, CEREALS, PASTA, RICE, DRIED PEAS, AND BEANS These products are high in carbohydrates and most are low in fat. Therefore, they can be increased in the diet as substitutes for fatty foods. They too, however, contain calories and should not be eaten in excess. Cereals can be eaten  for snacks as well as for breakfast.  Include foods that contain fiber (fruits, vegetables, whole grains, and legumes). Research shows that fiber may lower blood cholesterol levels, especially the water-soluble fiber found in fruits, vegetables, oat products, and legumes. FRUITS AND VEGETABLES It is good to eat fruits and vegetables. Besides being sources of fiber, both are rich in vitamins and some minerals. They help you get the daily allowances of these nutrients. Fruits and vegetables can be used for snacks and desserts. MEATS Limit lean meat, chicken, [place], and fish to no more than 6 ounces per day. Beef, Pork, and Lamb Use lean cuts of beef, pork, and lamb. Lean cuts include:  Extra-lean ground beef.  Arm roast.  Sirloin tip.  Center-cut ham.  Round steak.   Loin chops.  Rump roast.  Tenderloin.  Trim all fat off the outside of meats before cooking. It is not necessary to severely decrease the intake of red meat, but lean choices should be made. Lean meat is rich in protein and contains a highly absorbable form of iron. Premenopausal women, in particular, should avoid reducing lean red meat because this could increase the risk for low red blood cells (iron-deficiency anemia). The organ meats, such as liver, sweetbreads, kidneys, and brain are very rich in cholesterol. They should be limited. Chicken and [place] These are good sources of protein. The fat of poultry can be reduced by removing the skin and underlying fat layers before cooking. Chicken and [place] can be substituted for lean red meat in the diet. Poultry should not be fried or covered with high-fat sauces. Fish and Shellfish Fish is a good source of protein. Shellfish contain cholesterol, but they usually are low in saturated fatty acids. The preparation of fish is important. Like chicken and [place], they should not be fried or covered with high-fat sauces. EGGS Egg whites contain no fat or cholesterol. They can be eaten often. Try 1 to 2 egg whites instead of whole eggs in recipes or use egg substitutes that do not contain yolk. MILK AND DAIRY PRODUCTS Use skim or 1% milk instead of 2% or whole milk. Decrease whole milk, natural, and processed cheeses. Use nonfat or low-fat (2%) cottage cheese or low-fat cheeses made from vegetable oils. Choose nonfat or low-fat (1 to 2%) yogurt. Experiment with evaporated skim milk in recipes that call for heavy cream. Substitute low-fat yogurt or low-fat cottage cheese for sour cream in dips and salad dressings. Have at least 2 servings of low-fat dairy products, such as 2 glasses of skim (or 1%) milk each day to help get your daily calcium intake.  FATS AND OILS Reduce the total intake of fats, especially saturated fat. Butterfat, lard, and beef fats are  high in saturated fat and cholesterol. These should be avoided as much as possible. Vegetable fats do not contain cholesterol, but certain vegetable fats, such as coconut oil, palm oil, and palm kernel oil are very high in saturated fats. These should be limited. These fats are often used in bakery goods, processed foods, popcorn, oils, and nondairy creamers. Vegetable shortenings and some peanut butters contain hydrogenated oils, which are also saturated fats. Read the labels on these foods and check for saturated vegetable oils. Unsaturated vegetable oils and fats do not raise blood cholesterol. However, they should be limited because they are fats and are high in calories. Total fat should still be limited to 30% of your daily caloric intake. Desirable liquid vegetable oils are corn oil, cottonseed oil, olive  oil, canola oil, safflower oil, soybean oil, and sunflower oil. Peanut oil is not as good, but small amounts are acceptable. Buy a heart-healthy tub margarine that has no partially hydrogenated oils in the ingredients. Mayonnaise and salad dressings often are made from unsaturated fats, but they should also be limited because of their high calorie and fat content. Seeds, nuts, peanut butter, olives, and avocados are high in fat, but the fat is mainly the unsaturated type. These foods should be limited mainly to avoid excess calories and fat. OTHER EATING TIPS Snacks  Most sweets should be limited as snacks. They tend to be rich in calories and fats, and their caloric content outweighs their nutritional value. Some good choices in snacks are graham crackers, melba toast, soda crackers, bagels (no egg), English muffins, fruits, and vegetables. These snacks are preferable to snack crackers, [place] fries, and chips. Popcorn should be air-popped or cooked in small amounts of liquid vegetable oil. Desserts Eat fruit, low-fat yogurt, and fruit ices. AVOID pastries, cake, and cookies. Sherbet, angel food  cake, gelatin dessert, frozen low-fat yogurt, or other frozen products that do not contain saturated fat (pure fruit juice bars, frozen ice pops) are also acceptable.  COOKING METHODS Choose those methods that use little or no fat. They include: Poaching.  Braising.  Steaming.  Grilling.  Baking.  Stir-frying.  Broiling.  Microwaving.  Foods can be cooked in a nonstick pan without added fat, or use a nonfat cooking spray in regular cookware. Limit fried foods and avoid frying in saturated fat. Add moisture to lean meats by using water, broth, cooking wines, and other nonfat or low-fat sauces along with the cooking methods mentioned above. Soups and stews should be chilled after cooking. The fat that forms on top after a few hours in the refrigerator should be skimmed off. When preparing meals, avoid using excess salt. Salt can contribute to raising blood pressure in some people. EATING AWAY FROM HOME Order entres, potatoes, and vegetables without sauces or butter. When meat exceeds the size of a deck of cards (3 to 4 ounces), the rest can be taken home for another meal. Choose vegetable or fruit salads and ask for low-calorie salad dressings to be served on the side. Use dressings sparingly. Limit high-fat toppings, such as bacon, crumbled eggs, cheese, sunflower seeds, and olives. Ask for heart-healthy tub margarine instead of butter.  Hemorrhoids Hemorrhoids are dilated (enlarged) veins around the rectum. Sometimes clots will form in the veins. This makes them swollen and painful. These are called thrombosed hemorrhoids. Causes of hemorrhoids include:  Constipation.   Straining to have a bowel movement.   HEAVY LIFTING HOME CARE INSTRUCTIONS  Eat a well balanced diet and drink 6 to 8 glasses of water every day to avoid constipation. You may also use a bulk laxative.   Avoid straining to have bowel movements.   Keep anal area dry and clean.   Do not use a donut shaped pillow or  sit on the toilet for long periods. This increases blood pooling and pain.   Move your bowels when your body has the urge; this will require less straining and will decrease pain and pressure. =

## 2015-10-09 NOTE — H&P (Signed)
  Primary Care Physician:  Rosita Fire, MD Primary Gastroenterologist:  Dr. Oneida Alar  Pre-Procedure History & Physical: HPI:  Terri Bauer is a 50 y.o. female here for Anemia-NORMOCYTIC.  Past Medical History  Diagnosis Date  . Diabetes ([place])   . Bronchitis   . Hypertension   . Other intellectual disabilities     sees psychiatrist  . Intellectual delay   . Poor historian     limited due to intellectual disabilities.    Past Surgical History  Procedure Laterality Date  . None      Prior to Admission medications   Medication Sig Start Date End Date Taking? Authorizing Provider  amLODipine (NORVASC) 10 MG tablet Take 10 mg by mouth daily.   Yes Historical Provider, MD  benztropine (COGENTIN) 2 MG tablet Take 2 mg by mouth daily.   Yes Historical Provider, MD  ferrous sulfate 325 (65 FE) MG tablet Take 325 mg by mouth 2 (two) times daily with a meal.   Yes Historical Provider, MD  haloperidol (HALDOL) 5 MG tablet Take 5 mg by mouth at bedtime.   Yes Historical Provider, MD  losartan-hydrochlorothiazide (HYZAAR) 50-12.5 MG tablet Take 1 tablet by mouth daily.   Yes Historical Provider, MD  metFORMIN (GLUCOPHAGE) 500 MG tablet Take 500 mg by mouth 2 (two) times daily with a meal.   Yes Historical Provider, MD  Na Sulfate-K Sulfate-Mg Sulf SOLN Take 1 kit by mouth once. 09/21/15 10/21/15 Yes Orvil Feil, NP    Allergies as of 09/21/2015  . (No Known Allergies)    Family History  Problem Relation Age of Onset  . Colon cancer Father     diagnosed in his 15s, deceased     Social History   Social History  . Marital Status: Single    Spouse Name: N/A  . Number of Children: N/A  . Years of Education: N/A   Occupational History  . Not on file.   Social History Main Topics  . Smoking status: Never Smoker   . Smokeless tobacco: Not on file  . Alcohol Use: No  . Drug Use: No  . Sexual Activity: No   Other Topics Concern  . Not on file   Social History Narrative   Lives with mother. Has a psychiatrist and states "I don't know why I see them".     Review of Systems: See HPI, otherwise negative ROS   Physical Exam: BP 145/91 mmHg  Temp(Src) 97.8 F (36.6 C) (Oral)  Resp 12  SpO2 98% General:   Alert,  pleasant and cooperative in NAD Head:  Normocephalic and atraumatic. Neck:  Supple; Lungs:  Clear throughout to auscultation.    Heart:  Regular rate and rhythm. Abdomen:  Soft, nontender and nondistended. Normal bowel sounds, without guarding, and without rebound.   Neurologic:  Alert and  oriented x4;  grossly normal neurologically.  Impression/Plan:     Anemia-NORMOCYTIC  PLAN:  1. EGD/TCS TODAY

## 2015-10-09 NOTE — Op Note (Signed)
[facility] Patient Name: Terri Bauer Procedure Date: 10/09/2015 8:55 AM MRN: [id number] Date of Birth: 04/16/1966 Attending MD: Jonette Eva , MD CSN: [id number] Age: 50 Admit Type: Outpatient Procedure:                Colonoscopy Indications:              Anemia Providers:                Jonette Eva, MD, Brain Hilts, RN, Calton Dach,                            Technician Referring MD:             [name] (Referring MD) Medicines:                Propofol per Anesthesia Complications:            No immediate complications. Estimated Blood Loss:     Estimated blood loss: none. Procedure:                Pre-Anesthesia Assessment:                           - Prior to the procedure, a History and Physical                            was performed, and patient medications and                            allergies were reviewed. The patient's tolerance of                            previous anesthesia was also reviewed. The risks                            and benefits of the procedure and the sedation                            options and risks were discussed with the patient.                            All questions were answered, and informed consent                            was obtained. Prior Anticoagulants: The patient has                            taken no previous anticoagulant or antiplatelet                            agents. ASA Grade Assessment: II - A patient with                            mild systemic disease. After reviewing the risks  and benefits, the patient was deemed in                            satisfactory condition to undergo the procedure.                           After obtaining informed consent, the colonoscope                            was passed under direct vision. Throughout the                            procedure, the patient's blood pressure, pulse, and                            oxygen saturations were  monitored continuously. The                            [id number] ([id number]) scope was introduced through                            the anus and advanced to the the cecum, identified                            by appendiceal orifice and ileocecal valve. After                            obtaining informed consent, the colonoscope was                            passed under direct vision. Throughout the                            procedure, the patient's blood pressure, pulse, and                            oxygen saturations were monitored continuously. The                            [id number] ([id number]) was introduced through the and                            advanced to the. The ileocecal valve, appendiceal                            orifice, and rectum were photographed. The                            colonoscopy was performed without difficulty. The                            quality of the bowel preparation was excellent. Scope In: 9:07:25 AM Scope Out: 9:18:01 AM Scope Withdrawal Time: 0 hours 6 minutes 6 seconds  Total Procedure Duration: 0  hours 10 minutes 36 seconds  Findings:      The digital rectal exam was normal.      The sigmoid colon and descending colon were mildly redundant. Advancing       the scope required using manual pressure.      Non-bleeding internal hemorrhoids were found. The hemorrhoids were       moderate. Impression:               - Redundant colon.                           - Non-bleeding internal hemorrhoids.                           - No specimens collected. Moderate Sedation:      Per Anesthesia Care Recommendation:           - Patient has a contact number available for                            emergencies. The signs and symptoms of potential                            delayed complications were discussed with the                            patient. Return to normal activities tomorrow.                            Written discharge instructions were  provided to the                            patient.                           - High fiber diet and low fat diet.                           - Continue present medications.                           - Repeat colonoscopy in 10 years for surveillance. Procedure Code(s):        --- Professional ---                           [phone], Colonoscopy, flexible; diagnostic, including                            collection of specimen(s) by brushing or washing,                            when performed (separate procedure) Diagnosis Code(s):        --- Professional ---                           X91.4, Other hemorrhoids  D64.9, Anemia, unspecified                           Q43.8, Other specified congenital malformations of                            intestine CPT copyright 2016 [employer]. All rights reserved. The codes documented in this report are preliminary and upon coder review may  be revised to meet current compliance requirements. Jonette EvaSandi Farida Mcreynolds, MD Jonette EvaSandi Thorn Demas, MD 10/09/2015 11:16:47 AM This report has been signed electronically. Number of Addenda: 0

## 2015-10-09 NOTE — Transfer of Care (Signed)
Immediate Anesthesia Transfer of Care Note  Patient: Terri GrinderSabrina Barno  Procedure(s) Performed: Procedure(s) with comments: COLONOSCOPY WITH PROPOFOL (N/A) - 1100-moved to 845 per Ginger  Patient Location: PACU  Anesthesia Type:MAC  Level of Consciousness: sedated  Airway & Oxygen Therapy: Patient Spontanous Breathing and Patient connected to face mask oxygen  Post-op Assessment: Report given to RN and Post -op Vital signs reviewed and stable  Post vital signs: Reviewed and stable  Last Vitals:  Filed Vitals:   10/09/15 0840 10/09/15 0845  BP: 111/69   Temp:    Resp: 16 25    Complications: No apparent anesthesia complications

## 2015-10-10 ENCOUNTER — Other Ambulatory Visit: Payer: Self-pay

## 2015-10-10 DIAGNOSIS — D539 Nutritional anemia, unspecified: Secondary | ICD-10-CM

## 2015-10-10 NOTE — Progress Notes (Signed)
Quick Note:  Pt is aware and said she will go to the lab Friday. ______

## 2015-10-15 ENCOUNTER — Encounter (HOSPITAL_COMMUNITY): Payer: Self-pay | Admitting: Gastroenterology

## 2015-10-15 ENCOUNTER — Telehealth: Payer: Self-pay | Admitting: Gastroenterology

## 2015-10-15 NOTE — Telephone Encounter (Signed)
I called pt and she is not having any problems. She just said that Dr. Darrick PennaFields told her she had a spot on her but when she did her procedures.  I told her that all I see is that she had internal hemorrhoids and I don't see anything about a spot on her but.  I told her that I will let Dr. Darrick PennaFields know that she called.

## 2015-10-15 NOTE — Telephone Encounter (Signed)
[phone]  PLEASE CALL PATIENT, SHE IS STATING THAT SHE HAS A SPOT ON HER BUTT THAT NEEDS TO BE TAKEN OFF

## 2015-10-24 NOTE — Telephone Encounter (Signed)
PLEASE CALL PT. I REVIEWED HER REPORT. SHE HAS INTERNAL HEMORRHOID BUT IF SHE IS NOT HAVING SYMPTOMS SHE DOES NOT NEED TO HAVE THEM REMOVED.

## 2015-10-25 NOTE — Telephone Encounter (Signed)
Pt is aware.  

## 2015-10-28 ENCOUNTER — Telehealth: Payer: Self-pay | Admitting: Gastroenterology

## 2015-10-28 MED ORDER — BIS SUBCIT-METRONID-TETRACYC 140-125-125 MG PO CAPS: ORAL_CAPSULE | ORAL | Status: DC

## 2015-10-28 MED ORDER — OMEPRAZOLE 20 MG PO CPDR: DELAYED_RELEASE_CAPSULE | ORAL | Status: DC

## 2015-10-28 NOTE — Telephone Encounter (Signed)
PLEASE CALL PT. She has H. Pylori gastritis. She has TO TAKE HALDOL SO SHE CANNOT TAKE AN ALTERNATIVE REGIMEN DUE TO A DRUG INTERACTION. So she needs PYLERA 3 PILLS QID FOR 10 DAYS. She should take OMEPRAZOLE BID for 3 MOS. The meds can cause nausea, vomiting, abd cramps, loose stools, black colored stools, and metallic taste in her mouth. I CHECKED YOUR  VITAMIN B12 LEVEL and it is low.   SHE SHOULD SEE DR. FANTA FOR B12 SHOTS.  FOLLOW A LOW FAT/HIGH FIBER DIET. AVOID ITEMS THAT CAUSE BLOATING.   AVOID ITEMS THAT TRIGGER GASTRITIS. SEE INFO BELOW  FOLLOW UP IN 4 MOS E30 H PYLORI GASTRITIS, VIT B12 DEFICIENCY.   NEXT COLONOSCOPY IN 10 YEARS.

## 2015-10-29 ENCOUNTER — Encounter: Payer: Self-pay | Admitting: Gastroenterology

## 2015-10-29 NOTE — Telephone Encounter (Signed)
APPT MADE AND LETTER SENT  °

## 2015-10-29 NOTE — Telephone Encounter (Signed)
Reminder in epic °

## 2015-10-29 NOTE — Telephone Encounter (Signed)
Pt is aware and will call if she has questions.  

## 2016-02-28 ENCOUNTER — Telehealth: Payer: Self-pay | Admitting: Gastroenterology

## 2016-02-28 ENCOUNTER — Encounter: Payer: Self-pay | Admitting: Gastroenterology

## 2016-02-28 ENCOUNTER — Ambulatory Visit: Payer: Medicaid Other | Admitting: Gastroenterology

## 2016-02-28 NOTE — Telephone Encounter (Signed)
PATIENT WAS A NO SHOW AND LETTER SENT  °

## 2016-03-10 ENCOUNTER — Telehealth: Payer: Self-pay | Admitting: Gastroenterology

## 2016-03-10 ENCOUNTER — Ambulatory Visit: Payer: Medicaid Other | Admitting: Gastroenterology

## 2016-03-10 ENCOUNTER — Encounter: Payer: Self-pay | Admitting: Gastroenterology

## 2016-03-10 NOTE — Telephone Encounter (Signed)
PT WAS A NO SHOW AND LETTER SENT  °

## 2016-03-28 ENCOUNTER — Telehealth: Payer: Self-pay

## 2016-03-28 NOTE — Telephone Encounter (Signed)
Pt called and request for LSL to call her back. Stated LSL called her a few weeks ago and she just returned home and got the message.

## 2016-03-28 NOTE — Telephone Encounter (Signed)
I have never seen the patient, therefore would not have a reason to call her. Would have to have been someone else. I don't see any documentation in the chart that we reached out to her ALTHOUGH she may have gotten a phone call to reminder her of her appointment here. She NO SHOWED 2 appointments in the last one month.

## 2016-03-31 NOTE — Telephone Encounter (Signed)
Noted  

## 2016-05-14 ENCOUNTER — Other Ambulatory Visit: Payer: Self-pay | Admitting: Gastroenterology

## 2016-06-09 ENCOUNTER — Other Ambulatory Visit (HOSPITAL_COMMUNITY)
Admission: RE | Admit: 2016-06-09 | Discharge: 2016-06-09 | Disposition: A | Discharge status: Home / Self Care | Payer: Medicaid Other | Source: Ambulatory Visit | Attending: Internal Medicine | Admitting: Internal Medicine

## 2016-06-09 DIAGNOSIS — J40 Bronchitis, not specified as acute or chronic: Secondary | ICD-10-CM | POA: Insufficient documentation

## 2016-06-09 DIAGNOSIS — E119 Type 2 diabetes mellitus without complications: Secondary | ICD-10-CM | POA: Diagnosis present

## 2016-06-09 DIAGNOSIS — I10 Essential (primary) hypertension: Secondary | ICD-10-CM | POA: Diagnosis present

## 2016-06-09 DIAGNOSIS — F79 Unspecified intellectual disabilities: Secondary | ICD-10-CM | POA: Diagnosis present

## 2016-06-09 LAB — CBC WITH DIFFERENTIAL/PLATELET
BASOS ABS: 0 10*3/uL (ref 0.0–0.1)
BASOS PCT: 0 %
Eosinophils Absolute: 0.5 10*3/uL (ref 0.0–0.7)
Eosinophils Relative: 10 %
HEMATOCRIT: 35.5 % — AB (ref 36.0–46.0)
HEMOGLOBIN: 11.7 g/dL — AB (ref 12.0–15.0)
Lymphocytes Relative: 43 %
Lymphs Abs: 2.2 10*3/uL (ref 0.7–4.0)
MCH: 32.6 pg (ref 26.0–34.0)
MCHC: 33 g/dL (ref 30.0–36.0)
MCV: 98.9 fL (ref 78.0–100.0)
Monocytes Absolute: 0.2 10*3/uL (ref 0.1–1.0)
Monocytes Relative: 5 %
NEUTROS ABS: 2.1 10*3/uL (ref 1.7–7.7)
NEUTROS PCT: 42 %
Platelets: 256 10*3/uL (ref 150–400)
RBC: 3.59 MIL/uL — ABNORMAL LOW (ref 3.87–5.11)
RDW: 13.3 % (ref 11.5–15.5)
WBC: 4.9 10*3/uL (ref 4.0–10.5)

## 2016-06-09 LAB — BASIC METABOLIC PANEL
Anion gap: 8 (ref 5–15)
BUN: 10 mg/dL (ref 6–20)
CHLORIDE: 101 mmol/L (ref 101–111)
CO2: 28 mmol/L (ref 22–32)
CREATININE: 0.95 mg/dL (ref 0.44–1.00)
Calcium: 9.4 mg/dL (ref 8.9–10.3)
GFR calc Af Amer: >60 mL/min (ref >60)
GFR calc non Af Amer: >60 mL/min (ref >60)
Glucose, Bld: 136 mg/dL — ABNORMAL HIGH (ref 65–99)
Potassium: 3.7 mmol/L (ref 3.5–5.1)
SODIUM: 137 mmol/L (ref 135–145)

## 2016-06-09 LAB — HEPATIC FUNCTION PANEL
ALBUMIN: 3.8 g/dL (ref 3.5–5.0)
ALK PHOS: 89 U/L (ref 38–126)
ALT: 14 U/L (ref 14–54)
AST: 16 U/L (ref 15–41)
BILIRUBIN TOTAL: 0.4 mg/dL (ref 0.3–1.2)
Bilirubin, Direct: <0.1 mg/dL — ABNORMAL LOW (ref 0.1–0.5)
Total Protein: 7.4 g/dL (ref 6.5–8.1)

## 2016-06-09 LAB — LIPID PANEL
Cholesterol: 196 mg/dL (ref 0–200)
HDL: 49 mg/dL (ref >40)
LDL CALC: 118 mg/dL — AB (ref 0–99)
Total CHOL/HDL Ratio: 4 RATIO
Triglycerides: 147 mg/dL (ref <150)
VLDL: 29 mg/dL (ref 0–40)

## 2016-06-10 LAB — HEMOGLOBIN A1C
Hgb A1c MFr Bld: 5.8 % — ABNORMAL HIGH (ref 4.8–5.6)
Mean Plasma Glucose: 120 mg/dL

## 2018-07-13 ENCOUNTER — Other Ambulatory Visit (HOSPITAL_COMMUNITY): Payer: Self-pay | Admitting: Internal Medicine

## 2018-07-13 DIAGNOSIS — Z1231 Encounter for screening mammogram for malignant neoplasm of breast: Secondary | ICD-10-CM

## 2018-07-21 ENCOUNTER — Ambulatory Visit (HOSPITAL_COMMUNITY)
Admission: RE | Admit: 2018-07-21 | Discharge: 2018-07-21 | Disposition: A | Discharge status: Home / Self Care | Payer: Medicaid Other | Source: Ambulatory Visit | Attending: Internal Medicine | Admitting: Internal Medicine

## 2018-07-21 DIAGNOSIS — Z1231 Encounter for screening mammogram for malignant neoplasm of breast: Secondary | ICD-10-CM | POA: Diagnosis not present

## 2018-07-26 ENCOUNTER — Other Ambulatory Visit (HOSPITAL_COMMUNITY): Payer: Self-pay | Admitting: Internal Medicine

## 2018-07-26 DIAGNOSIS — R928 Other abnormal and inconclusive findings on diagnostic imaging of breast: Secondary | ICD-10-CM

## 2018-08-24 ENCOUNTER — Encounter (HOSPITAL_COMMUNITY): Payer: Self-pay

## 2018-08-24 ENCOUNTER — Ambulatory Visit (HOSPITAL_COMMUNITY): Admission: RE | Admit: 2018-08-24 | Payer: Medicaid Other | Source: Ambulatory Visit

## 2018-08-24 ENCOUNTER — Inpatient Hospital Stay (HOSPITAL_COMMUNITY): Admission: RE | Admit: 2018-08-24 | Payer: Medicaid Other | Source: Ambulatory Visit

## 2018-08-31 ENCOUNTER — Other Ambulatory Visit: Payer: Self-pay | Admitting: Internal Medicine

## 2018-08-31 ENCOUNTER — Ambulatory Visit (HOSPITAL_COMMUNITY)
Admission: RE | Admit: 2018-08-31 | Discharge: 2018-08-31 | Disposition: A | Discharge status: Home / Self Care | Payer: Medicaid Other | Source: Ambulatory Visit | Attending: Internal Medicine | Admitting: Internal Medicine

## 2018-08-31 ENCOUNTER — Encounter (HOSPITAL_COMMUNITY): Payer: Medicaid Other

## 2018-08-31 ENCOUNTER — Other Ambulatory Visit (HOSPITAL_COMMUNITY): Payer: Medicaid Other

## 2018-08-31 DIAGNOSIS — R928 Other abnormal and inconclusive findings on diagnostic imaging of breast: Secondary | ICD-10-CM | POA: Diagnosis not present

## 2018-08-31 DIAGNOSIS — N6489 Other specified disorders of breast: Secondary | ICD-10-CM

## 2018-09-06 ENCOUNTER — Ambulatory Visit
Admission: RE | Admit: 2018-09-06 | Discharge: 2018-09-06 | Disposition: A | Discharge status: Home / Self Care | Payer: Medicaid Other | Source: Ambulatory Visit | Attending: Internal Medicine | Admitting: Internal Medicine

## 2018-09-06 DIAGNOSIS — N6489 Other specified disorders of breast: Secondary | ICD-10-CM

## 2018-10-07 ENCOUNTER — Ambulatory Visit: Payer: Medicaid Other | Admitting: General Surgery

## 2018-10-28 ENCOUNTER — Ambulatory Visit: Payer: Medicaid Other | Admitting: General Surgery

## 2018-12-02 ENCOUNTER — Ambulatory Visit: Payer: Medicaid Other | Admitting: General Surgery

## 2019-07-20 ENCOUNTER — Encounter (HOSPITAL_COMMUNITY): Payer: Self-pay | Admitting: Emergency Medicine

## 2019-07-20 ENCOUNTER — Emergency Department (HOSPITAL_COMMUNITY): Payer: Medicaid Other

## 2019-07-20 ENCOUNTER — Emergency Department (HOSPITAL_COMMUNITY)
Admission: EM | Admit: 2019-07-20 | Discharge: 2019-07-20 | Disposition: A | Discharge status: Home / Self Care | Payer: Medicaid Other | Attending: Emergency Medicine | Admitting: Emergency Medicine

## 2019-07-20 ENCOUNTER — Other Ambulatory Visit: Payer: Self-pay

## 2019-07-20 ENCOUNTER — Emergency Department (HOSPITAL_COMMUNITY)
Admission: EM | Admit: 2019-07-20 | Discharge: 2019-07-20 | Disposition: A | Discharge status: Nursing Home (Includes ICF) | Payer: Medicaid Other | Source: Home / Self Care | Attending: Emergency Medicine | Admitting: Emergency Medicine

## 2019-07-20 DIAGNOSIS — E119 Type 2 diabetes mellitus without complications: Secondary | ICD-10-CM | POA: Diagnosis not present

## 2019-07-20 DIAGNOSIS — Z7984 Long term (current) use of oral hypoglycemic drugs: Secondary | ICD-10-CM | POA: Insufficient documentation

## 2019-07-20 DIAGNOSIS — Y939 Activity, unspecified: Secondary | ICD-10-CM | POA: Insufficient documentation

## 2019-07-20 DIAGNOSIS — M79672 Pain in left foot: Secondary | ICD-10-CM | POA: Insufficient documentation

## 2019-07-20 DIAGNOSIS — I10 Essential (primary) hypertension: Secondary | ICD-10-CM | POA: Diagnosis not present

## 2019-07-20 DIAGNOSIS — N3000 Acute cystitis without hematuria: Secondary | ICD-10-CM

## 2019-07-20 DIAGNOSIS — Y999 Unspecified external cause status: Secondary | ICD-10-CM | POA: Diagnosis not present

## 2019-07-20 DIAGNOSIS — W06XXXA Fall from bed, initial encounter: Secondary | ICD-10-CM | POA: Insufficient documentation

## 2019-07-20 DIAGNOSIS — Z79899 Other long term (current) drug therapy: Secondary | ICD-10-CM | POA: Insufficient documentation

## 2019-07-20 DIAGNOSIS — Y92003 Bedroom of unspecified non-institutional (private) residence as the place of occurrence of the external cause: Secondary | ICD-10-CM | POA: Insufficient documentation

## 2019-07-20 DIAGNOSIS — R9431 Abnormal electrocardiogram [ECG] [EKG]: Secondary | ICD-10-CM

## 2019-07-20 DIAGNOSIS — E876 Hypokalemia: Secondary | ICD-10-CM

## 2019-07-20 LAB — URINALYSIS, ROUTINE W REFLEX MICROSCOPIC
Bilirubin Urine: NEGATIVE
Glucose, UA: NEGATIVE mg/dL
Ketones, ur: NEGATIVE mg/dL
Nitrite: NEGATIVE
Protein, ur: NEGATIVE mg/dL
Specific Gravity, Urine: 1.004 — ABNORMAL LOW (ref 1.005–1.030)
pH: 7 (ref 5.0–8.0)

## 2019-07-20 MED ORDER — CEPHALEXIN 500 MG PO CAPS: ORAL_CAPSULE | ORAL | 0 refills | Status: DC

## 2019-07-20 MED ORDER — IBUPROFEN 600 MG PO TABS: 600.0000 mg | ORAL_TABLET | Freq: Four times a day (QID) | ORAL | 0 refills | Status: DC | PRN

## 2019-07-20 MED ORDER — CEPHALEXIN 500 MG PO CAPS
1000.0000 mg | ORAL_CAPSULE | Freq: Once | ORAL | Status: AC
  Administered 2019-07-20: 1000 mg via ORAL
  Filled 2019-07-20: qty 2

## 2019-07-20 MED ORDER — POTASSIUM CHLORIDE CRYS ER 20 MEQ PO TBCR
40.0000 meq | EXTENDED_RELEASE_TABLET | Freq: Once | ORAL | Status: AC
  Administered 2019-07-20: 40 meq via ORAL
  Filled 2019-07-20: qty 2

## 2019-07-20 MED ORDER — POTASSIUM CHLORIDE CRYS ER 20 MEQ PO TBCR: 20.0000 meq | EXTENDED_RELEASE_TABLET | Freq: Every day | ORAL | 0 refills | Status: DC

## 2019-07-20 MED ORDER — IBUPROFEN 800 MG PO TABS
800.0000 mg | ORAL_TABLET | Freq: Once | ORAL | Status: AC
  Administered 2019-07-20: 10:00:00 800 mg via ORAL
  Filled 2019-07-20: qty 1

## 2019-07-20 NOTE — Discharge Instructions (Addendum)
Your x-rays are negative for any bony injuries including broken bones or dislocation.  I recommend using an ice pack to your foot is much as it is comfortable for the next several days.  You may also use ibuprofen for pain relief.

## 2019-07-20 NOTE — ED Provider Notes (Signed)
[facility] EMERGENCY DEPARTMENT Provider Note   CSN: [id number] Arrival date & time: 07/20/19  2376     History Chief Complaint  Patient presents with  . Toe Pain    Terri Bauer is a 54 y.o. female with a history of DM and HTN, presenting for evaluation of left distal foot pain after accidentally falling out of bed around 5 am today.  She describes pain across her entire distal foot radiating into her toes.  She has had no treatments prior to arrival.  She has been able to weight bear since the event.  HPI     Past Medical History:  Diagnosis Date  . Bronchitis   . Diabetes (HCC)   . Hypertension   . Intellectual delay   . Other intellectual disabilities    sees psychiatrist  . Poor historian    limited due to intellectual disabilities.    Patient Active Problem List   Diagnosis Date Noted  . Encounter for screening colonoscopy 09/21/2015  . FH: colon cancer 09/21/2015    Past Surgical History:  Procedure Laterality Date  . COLONOSCOPY WITH PROPOFOL N/A 10/09/2015   Procedure: COLONOSCOPY WITH PROPOFOL;  Surgeon: West Bali, MD;  Location: [facility];  Service: Endoscopy;  Laterality: N/A;  1100-moved to 845 per Ginger  . none       OB History   No obstetric history on file.     Family History  Problem Relation Age of Onset  . Colon cancer Father        diagnosed in his 13s, deceased     Social History   Tobacco Use  . Smoking status: Never Smoker  . Smokeless tobacco: Never Used  Substance Use Topics  . Alcohol use: No    Alcohol/week: 0.0 standard drinks  . Drug use: No    Home Medications Prior to Admission medications   Medication Sig Start Date End Date Taking? Authorizing Provider  amLODipine (NORVASC) 10 MG tablet Take 10 mg by mouth daily.    [provider]  benztropine (COGENTIN) 2 MG tablet Take 2 mg by mouth daily.    [provider]  bismuth-metronidazole-tetracycline (PYLERA) 140-125-125 MG capsule 3  PO QID FOR 10 DAYS 10/28/15   Fields, Darleene Cleaver, MD  ferrous sulfate 325 (65 FE) MG tablet Take 325 mg by mouth 2 (two) times daily with a meal.    [provider]  haloperidol (HALDOL) 5 MG tablet Take 5 mg by mouth at bedtime.    [provider]  ibuprofen (ADVIL) 600 MG tablet Take 1 tablet (600 mg total) by mouth every 6 (six) hours as needed. 07/20/19   Burgess Amor, PA-C  losartan-hydrochlorothiazide (HYZAAR) 50-12.5 MG tablet Take 1 tablet by mouth daily.    [provider]  metFORMIN (GLUCOPHAGE) 500 MG tablet Take 500 mg by mouth 2 (two) times daily with a meal.    [provider]  omeprazole (PRILOSEC) 20 MG capsule TAKE ONE CAPSULE BY MOUTH TWICE DAILY FOR 3 MONTHS THEN 1 CAPSULE EVERY DAY 05/16/16   Anice Paganini, NP    Allergies    Patient has no known allergies.  Review of Systems   Review of Systems  Constitutional: Negative for fever.  Musculoskeletal: Positive for arthralgias. Negative for joint swelling and myalgias.  Skin: Negative.  Negative for wound.  Neurological: Negative for weakness and numbness.    Physical Exam Updated Vital Signs BP (!) 106/56 (BP Location: Right Arm)   Pulse 84  Temp 98.6 F (37 C) (Oral)   Resp 14   Ht 5\' 1"  (1.549 m)   Wt 115.7 kg   SpO2 98%   BMI 48.18 kg/m   Physical Exam Constitutional:      Appearance: She is well-developed.  HENT:     Head: Atraumatic.  Cardiovascular:     Comments: Pulses equal bilaterally Musculoskeletal:        General: Tenderness present. No swelling or deformity.     Cervical back: Normal range of motion.  Skin:    General: Skin is warm and dry.     Capillary Refill: Capillary refill takes less than 2 seconds.  Neurological:     General: No focal deficit present.     Mental Status: She is alert.     Sensory: No sensory deficit.     Deep Tendon Reflexes: Reflexes normal.     ED Results / Procedures / Treatments   Labs (all labs ordered are listed, but only  abnormal results are displayed) Labs Reviewed - No data to display  EKG None  Radiology DG Foot Complete Left  Result Date: 07/20/2019 CLINICAL DATA:  Fall. EXAM: LEFT FOOT - COMPLETE 3+ VIEW COMPARISON:  None. FINDINGS: There is no evidence of fracture or dislocation. There is no evidence of arthropathy or other focal bone abnormality. Soft tissues are unremarkable. IMPRESSION: Negative. Electronically Signed   By: Monte Fantasia M.D.   On: 07/20/2019 08:24    Procedures Procedures (including critical care time)  Medications Ordered in ED Medications  ibuprofen (ADVIL) tablet 800 mg (has no administration in time range)    ED Course  I have reviewed the triage vital signs and the nursing notes.  Pertinent labs & imaging results that were available during my care of the patient were reviewed by me and considered in my medical decision making (see chart for details).    MDM Rules/Calculators/A&P                      Imaging reviewed and discussed with patient.  She has no fractures or dislocation.  Advised ice, elevation.  Ibuprofen.  As needed follow-up with PCP if symptoms are not improving over the next week. Final Clinical Impression(s) / ED Diagnoses Final diagnoses:  Foot pain, left    Rx / DC Orders ED Discharge Orders         Ordered    ibuprofen (ADVIL) 600 MG tablet  Every 6 hours PRN     07/20/19 0844           Evalee Jefferson, PA-C 07/20/19 0844    Horton, Barbette Hair, MD 07/20/19 [phone]

## 2019-07-20 NOTE — ED Triage Notes (Signed)
Pt from [facility] in [place]

## 2019-07-20 NOTE — ED Triage Notes (Signed)
Ems called because the patient rolled out of bed and called to help her up. Said she might as well go to the ER . Said all her toes hurt on left foot. No obv injuries.

## 2019-07-20 NOTE — ED Triage Notes (Signed)
Pt's caregiver states that she is having trouble walking and they think she may have a uti that is causing the trouble walking. Dr Felecia Shelling called and stated that her K is low and she needs some runs of K

## 2019-07-20 NOTE — ED Triage Notes (Signed)
Pt states that she fell out of bed and hurt her toes on her left foot

## 2019-07-20 NOTE — ED Provider Notes (Signed)
[facility] EMERGENCY DEPARTMENT Provider Note   CSN: [id number] Arrival date & time: 07/20/19  1148     History Chief Complaint  Patient presents with  . trouble walking  . possible uti    Terri Bauer is a 54 y.o. female who  has a past medical history of Bronchitis, Diabetes (HCC), Hypertension, Intellectual delay, Other intellectual disabilities, and Poor historian. She was sent in by her PCP for hypokalemia. Hx is gathered from the patient as well as phone call to her care takers. Care takers state that she seemed transiently confused this morning and they were worried about a UTI and that she has been weak and tired.  Patient states she is feeling well now.  HPI     Past Medical History:  Diagnosis Date  . Bronchitis   . Diabetes (HCC)   . Hypertension   . Intellectual delay   . Other intellectual disabilities    sees psychiatrist  . Poor historian    limited due to intellectual disabilities.    Patient Active Problem List   Diagnosis Date Noted  . Encounter for screening colonoscopy 09/21/2015  . FH: colon cancer 09/21/2015    Past Surgical History:  Procedure Laterality Date  . COLONOSCOPY WITH PROPOFOL N/A 10/09/2015   Procedure: COLONOSCOPY WITH PROPOFOL;  Surgeon: West Bali, MD;  Location: [facility];  Service: Endoscopy;  Laterality: N/A;  1100-moved to 845 per Ginger  . none       OB History   No obstetric history on file.     Family History  Problem Relation Age of Onset  . Colon cancer Father        diagnosed in his 40s, deceased     Social History   Tobacco Use  . Smoking status: Never Smoker  . Smokeless tobacco: Never Used  Substance Use Topics  . Alcohol use: No    Alcohol/week: 0.0 standard drinks  . Drug use: No    Home Medications Prior to Admission medications   Medication Sig Start Date End Date Taking? Authorizing Provider  amLODipine (NORVASC) 10 MG tablet Take 10 mg by mouth daily.    [provider]  benztropine (COGENTIN) 2 MG tablet Take 2 mg by mouth daily.    [provider]  bismuth-metronidazole-tetracycline (PYLERA) 140-125-125 MG capsule 3 PO QID FOR 10 DAYS 10/28/15   Fields, Darleene Cleaver, MD  ferrous sulfate 325 (65 FE) MG tablet Take 325 mg by mouth 2 (two) times daily with a meal.    [provider]  haloperidol (HALDOL) 5 MG tablet Take 5 mg by mouth at bedtime.    [provider]  ibuprofen (ADVIL) 600 MG tablet Take 1 tablet (600 mg total) by mouth every 6 (six) hours as needed. 07/20/19   Burgess Amor, PA-C  losartan-hydrochlorothiazide (HYZAAR) 50-12.5 MG tablet Take 1 tablet by mouth daily.    [provider]  metFORMIN (GLUCOPHAGE) 500 MG tablet Take 500 mg by mouth 2 (two) times daily with a meal.    [provider]  omeprazole (PRILOSEC) 20 MG capsule TAKE ONE CAPSULE BY MOUTH TWICE DAILY FOR 3 MONTHS THEN 1 CAPSULE EVERY DAY 05/16/16   Anice Paganini, NP    Allergies    Patient has no known allergies.  Review of Systems   Review of Systems Ten systems reviewed and are negative for acute change, except as noted in the HPI.   Physical Exam Updated Vital Signs BP (!) 123/46 (BP  Location: Right Arm)   Pulse 93   Temp 97.9 F (36.6 C) (Oral)   Resp 14   Wt 115.7 kg   SpO2 100%   BMI 48.20 kg/m   Physical Exam Vitals and nursing note reviewed.  Constitutional:      General: She is not in acute distress.    Appearance: She is well-developed. She is not diaphoretic.  HENT:     Head: Normocephalic and atraumatic.  Eyes:     General: No scleral icterus.    Conjunctiva/sclera: Conjunctivae normal.  Cardiovascular:     Rate and Rhythm: Normal rate and regular rhythm.     Heart sounds: Normal heart sounds. No murmur. No friction rub. No gallop.   Pulmonary:     Effort: Pulmonary effort is normal. No respiratory distress.     Breath sounds: Normal breath sounds.  Abdominal:     General: Bowel sounds are normal. There is  no distension.     Palpations: Abdomen is soft. There is no mass.     Tenderness: There is no abdominal tenderness. There is no right CVA tenderness, left CVA tenderness or guarding.  Musculoskeletal:     Cervical back: Normal range of motion.  Skin:    General: Skin is warm and dry.  Neurological:     Mental Status: She is alert and oriented to person, place, and time.  Psychiatric:        Behavior: Behavior normal.     ED Results / Procedures / Treatments   Labs (all labs ordered are listed, but only abnormal results are displayed) Labs Reviewed  URINALYSIS, ROUTINE W REFLEX MICROSCOPIC    EKG None  ED ECG REPORT   Date: 07/20/2019  Rate: 84  Rhythm: normal sinus rhythm  QRS Axis: normal  Intervals: QT prolonged  ST/T Wave abnormalities: nonspecific T wave changes  Conduction Disutrbances:none  Narrative Interpretation:   Old EKG Reviewed: unchanged except for new prolonged QT  I have personally reviewed the EKG tracing and agree with the computerized printout as noted.  Radiology DG Foot Complete Left  Result Date: 07/20/2019 CLINICAL DATA:  Fall. EXAM: LEFT FOOT - COMPLETE 3+ VIEW COMPARISON:  None. FINDINGS: There is no evidence of fracture or dislocation. There is no evidence of arthropathy or other focal bone abnormality. Soft tissues are unremarkable. IMPRESSION: Negative. Electronically Signed   By: Monte Fantasia M.D.   On: 07/20/2019 08:24    Procedures Procedures (including critical care time)  Medications Ordered in ED Medications - No data to display  ED Course  I have reviewed the triage vital signs and the nursing notes.  Pertinent labs & imaging results that were available during my care of the patient were reviewed by me and considered in my medical decision making (see chart for details).    MDM Rules/Calculators/A&P                      54 year old female with a history of schizophrenia.  I reviewed the patient's labs which show urinary  tract infection.  She will be treated with Keflex.  Her potassium was decreased at 3 today.  Patient be treated with oral potassium.  Given her first dose here and she will be discharged with 20 mg twice daily for the next 3 days.  Of note the patient's EKG does show a prolonged QT.  Appears to be new.  I spent multiple count phone calls trying to get in touch with her psychiatric provider  at [facility] to discuss medication management as I believe is secondary to her Haldol use.  Is unable to obtain them.  I did speak with our nurse practitioner Jeraldine Loots at [facility].  She recommends having the patient be seen immediately tomorrow morning at [facility].  I will not discontinue her antipsychotics at this time.  I discussed the case with her caretakers at her group home.  I have provided him with written detailed instructions, they know that she is to go tomorrow morning and see a psychiatrist at [facility].  I have given him a copy of the patient's EKG.  Patient appears appropriate for discharge at this time. Final Clinical Impression(s) / ED Diagnoses Final diagnoses:  Hypokalemia  Acute cystitis without hematuria  Prolonged Q-T interval on ECG    Rx / DC Orders ED Discharge Orders    None       Arthor Captain, PA-C 07/20/19 2151    Mancel Bale, MD 07/21/19 1650

## 2019-07-20 NOTE — Discharge Instructions (Signed)
Please have the patient seen immediately (first thing tomorrow) by her psychiatrist.  Her medication for schizophrenia is affecting her EKG and will need to be managed by her psychiatrist as soon as possible.  Contact a health care provider if: You are suffering from stress, fear, anxiety, or depression. You vomit. You have diarrhea. Get help right away if: You have chest pain or difficulty breathing. You have a fluttering feeling in your chest. You faint. You have a seizure.

## 2019-09-20 ENCOUNTER — Telehealth: Payer: Self-pay | Admitting: Adult Health

## 2019-09-20 NOTE — Telephone Encounter (Signed)

## 2019-09-21 ENCOUNTER — Other Ambulatory Visit: Payer: Self-pay

## 2019-09-21 ENCOUNTER — Encounter: Payer: Self-pay | Admitting: Adult Health

## 2019-09-21 ENCOUNTER — Ambulatory Visit (INDEPENDENT_AMBULATORY_CARE_PROVIDER_SITE_OTHER): Payer: Medicaid Other | Admitting: Adult Health

## 2019-09-21 ENCOUNTER — Other Ambulatory Visit (HOSPITAL_COMMUNITY)
Admission: RE | Admit: 2019-09-21 | Discharge: 2019-09-21 | Disposition: A | Discharge status: Home / Self Care | Payer: Medicaid Other | Source: Ambulatory Visit | Attending: Adult Health | Admitting: Adult Health

## 2019-09-21 VITALS — BP 135/92 | HR 82 | Ht 62.25 in | Wt 254.6 lb

## 2019-09-21 DIAGNOSIS — Z01419 Encounter for gynecological examination (general) (routine) without abnormal findings: Secondary | ICD-10-CM | POA: Insufficient documentation

## 2019-09-21 DIAGNOSIS — Z1211 Encounter for screening for malignant neoplasm of colon: Secondary | ICD-10-CM | POA: Insufficient documentation

## 2019-09-21 DIAGNOSIS — Z1212 Encounter for screening for malignant neoplasm of rectum: Secondary | ICD-10-CM

## 2019-09-21 DIAGNOSIS — Z0000 Encounter for general adult medical examination without abnormal findings: Secondary | ICD-10-CM | POA: Diagnosis not present

## 2019-09-21 DIAGNOSIS — R928 Other abnormal and inconclusive findings on diagnostic imaging of breast: Secondary | ICD-10-CM | POA: Insufficient documentation

## 2019-09-21 LAB — HEMOCCULT GUIAC POC 1CARD (OFFICE): Fecal Occult Blood, POC: NEGATIVE

## 2019-09-21 NOTE — Progress Notes (Signed)
Patient ID: Terri Bauer, female   DOB: Jan 24, 1966, 54 y.o.   MRN: [id number] History of Present Illness: Terri Bauer is a 54 year old black female,single,PM resides at [facility], in for well woman gyn exam and pap.She had abnormal mammogram in 08/2018 and had biopsy that showed complex scelerosing lesion and adenosis. Caregiver was with her.  PCP is Dr Felecia Shelling   Current Medications, Allergies, Past Medical History, Past Surgical History, Family History and Social History were reviewed in [employer] electronic medical record.     Review of Systems: Patient denies any headaches, hearing loss, fatigue, blurred vision, shortness of breath, chest pain, abdominal pain, problems with bowel movements, urination, or intercourse(not having sex) No joint pain or mood swings. Denies any vaginal bleeding since stopping per at 47 she says    Physical Exam:BP (!) 135/92 (BP Location: Left Arm, Patient Position: Sitting, Cuff Size: Large)   Pulse 82   Ht 5' 2.25" (1.581 m)   Wt 254 lb 9.6 oz (115.5 kg)   BMI 46.19 kg/m  General:  Well developed, well nourished, no acute distress Skin:  Warm and dry Neck:  Midline trachea, normal thyroid, good ROM, no lymphadenopathy Lungs; Clear to auscultation bilaterally Breast:  No dominant palpable mass, retraction, or nipple discharge,large  Cardiovascular: Regular rate and rhythm Abdomen:  Soft, non tender, no hepatosplenomegaly Pelvic:  External genitalia is normal in appearance, no lesions.  The vagina is normal in appearance. Urethra has no lesions or masses. The cervix is bulbous and smooth, pap with high risk HPV 16/18 genotyping performed .  Uterus is felt to be normal size, shape, and contour.  No adnexal masses or tenderness noted.Bladder is non tender, no masses felt. Rectal: Good sphincter tone, no polyps, or hemorrhoids felt.  Hemoccult negative. Extremities/musculoskeletal:  No swelling or varicosities noted, no clubbing or  cyanosis Psych:  No mood changes, alert and cooperative,seems happy Fall risk is low Assisted off the exam table. Examination chaperoned by Malachy Mood LPN  Impression and plan: 1. Encounter for gynecological examination with Papanicolaou smear of cervix Pap sent Pap in 3 years if normal Physical and labs with PCP   2. Screening for colorectal cancer Colonoscopy per GI  3. Abnormal mammogram of left breast Scheduled diagnostic mammogram and [place] at [facility] 10/11/19 at 11:30 am

## 2019-09-23 LAB — CYTOLOGY - PAP
Comment: NEGATIVE
Diagnosis: UNDETERMINED — AB
High risk HPV: NEGATIVE

## 2019-09-26 ENCOUNTER — Telehealth: Payer: Self-pay | Admitting: *Deleted

## 2019-09-26 NOTE — Telephone Encounter (Signed)
Spoke with [name] @ group home letting her know Terri Bauer's pap was negative for HPV but has some ASCUS. Will repeat pap in 3 years. [name] voiced understanding. JSY

## 2019-09-26 NOTE — Telephone Encounter (Signed)
-----   Message from Adline Potter, NP sent at 09/26/2019  1:45 PM EDT ----- Terri Bauer can you please call and let group home know pap was negative for HPV but has some ASCUS, repeat pap in 3 years per ASCCP guidelines

## 2019-09-27 NOTE — Progress Notes (Deleted)
Psychiatric Initial Adult Assessment   Patient Identification: Terri Bauer MRN:  [id number] Date of Evaluation:  09/27/2019 Referral Source: *** Chief Complaint:   Visit Diagnosis: No diagnosis found.  History of Present Illness:   Terri Bauer is a 54 y.o. year old female with a history of intellectual disability, hypertension, diabetes, who is referred for      Associated Signs/Symptoms: Depression Symptoms:  {DEPRESSION SYMPTOMS:20000} (Hypo) Manic Symptoms:  {BHH MANIC SYMPTOMS:22872} Anxiety Symptoms:  {BHH ANXIETY SYMPTOMS:22873} Psychotic Symptoms:  {BHH PSYCHOTIC SYMPTOMS:22874} PTSD Symptoms: {BHH PTSD SYMPTOMS:22875}  Past Psychiatric History:  Outpatient:  Psychiatry admission:  Previous suicide attempt:  Past trials of medication:  History of violence:   Previous Psychotropic Medications: {YES/NO:21197}  Substance Abuse History in the last 12 months:  {yes no:314532}  Consequences of Substance Abuse: {BHH CONSEQUENCES OF SUBSTANCE ABUSE:22880}  Past Medical History:  Past Medical History:  Diagnosis Date  . Bronchitis   . Diabetes ([place])   . Hypertension   . Intellectual delay   . Other intellectual disabilities    sees psychiatrist  . Poor historian    limited due to intellectual disabilities.    Past Surgical History:  Procedure Laterality Date  . COLONOSCOPY WITH PROPOFOL N/A 10/09/2015   Procedure: COLONOSCOPY WITH PROPOFOL;  Surgeon: Danie Binder, MD;  [facility];  Service: Endoscopy;  Laterality: N/A;  1100-moved to 845 per Ginger  . none      Family Psychiatric History: ***  Family History:  Family History  Problem Relation Age of Onset  . Colon cancer Father        diagnosed in his 28s, deceased   . Diabetes Brother     Social History:   Social History   Socioeconomic History  . Marital status: Single    Spouse name: Not on file  . Number of children: Not on file  . Years of education: Not on file  .  Highest education level: Not on file  Occupational History  . Not on file  Tobacco Use  . Smoking status: Never Smoker  . Smokeless tobacco: Never Used  Substance and Sexual Activity  . Alcohol use: No    Alcohol/week: 0.0 standard drinks  . Drug use: No  . Sexual activity: Not Currently    Birth control/protection: None  Other Topics Concern  . Not on file  Social History Narrative   Lives with mother. Has a psychiatrist and states "I don't know why I see them".    Social Determinants of Health   Financial Resource Strain:   . Difficulty of Paying Living Expenses:   Food Insecurity:   . Worried About Charity fundraiser in the Last Year:   . Arboriculturist in the Last Year:   Transportation Needs:   . Film/video editor (Medical):   Marland Kitchen Lack of Transportation (Non-Medical):   Physical Activity:   . Days of Exercise per Week:   . Minutes of Exercise per Session:   Stress:   . Feeling of Stress :   Social Connections:   . Frequency of Communication with Friends and Family:   . Frequency of Social Gatherings with Friends and Family:   . Attends Religious Services:   . Active Member of Clubs or Organizations:   . Attends Archivist Meetings:   Marland Kitchen Marital Status:     Additional Social History: ***  Allergies:  No Known Allergies  Metabolic Disorder Labs: Lab Results  Component Value Date  HGBA1C 5.8 (H) 06/09/2016   MPG 120 06/09/2016   No results found for: PROLACTIN Lab Results  Component Value Date   CHOL 196 06/09/2016   TRIG 147 06/09/2016   HDL 49 06/09/2016   CHOLHDL 4.0 06/09/2016   VLDL 29 06/09/2016   LDLCALC 118 (H) 06/09/2016   No results found for: TSH  Therapeutic Level Labs: No results found for: LITHIUM No results found for: CBMZ No results found for: VALPROATE  Current Medications: Current Outpatient Medications  Medication Sig Dispense Refill  . amLODipine (NORVASC) 10 MG tablet Take 10 mg by mouth daily.    .  benztropine (COGENTIN) 2 MG tablet Take 2 mg by mouth daily.    Elwin Sleight 200-5 MCG/ACT AERO SMARTSIG:2 Puff(s) By Mouth Twice Daily    . ferrous sulfate 325 (65 FE) MG tablet Take 325 mg by mouth 2 (two) times daily with a meal.    . fluticasone (FLONASE) 50 MCG/ACT nasal spray Place 1 spray into both nostrils daily.    . haloperidol (HALDOL) 5 MG tablet Take 5 mg by mouth at bedtime.    Marland Kitchen ketoconazole (NIZORAL) 2 % cream APPLY TO AFFECTED AREAVTWICE A DAY.I    . losartan-hydrochlorothiazide (HYZAAR) 50-12.5 MG tablet Take 1 tablet by mouth daily.    . metFORMIN (GLUCOPHAGE) 500 MG tablet Take 500 mg by mouth 2 (two) times daily with a meal.    . OLANZapine (ZYPREXA) 10 MG tablet Take 10 mg by mouth at bedtime.    Marland Kitchen omeprazole (PRILOSEC) 20 MG capsule TAKE ONE CAPSULE BY MOUTH TWICE DAILY FOR 3 MONTHS THEN 1 CAPSULE EVERY DAY 60 capsule 3  . potassium chloride SA (KLOR-CON) 20 MEQ tablet Take 1 tablet (20 mEq total) by mouth daily. 3 tablet 0   No current facility-administered medications for this visit.    Musculoskeletal: Strength & Muscle Tone: N/A Gait & Station: N/A Patient leans: N/A  Psychiatric Specialty Exam: Review of Systems  There were no vitals taken for this visit.There is no height or weight on file to calculate BMI.  General Appearance: Fairly Groomed  Eye Contact:  Good  Speech:  Clear and Coherent  Volume:  Normal  Mood:  {BHH MOOD:22306}  Affect:  {Affect (PAA):22687}  Thought Process:  {Thought Process (PAA):22688}  Orientation:  {BHH ORIENTATION (PAA):22689}  Thought Content:  {Thought Content:22690}  Suicidal Thoughts:  {ST/HT (PAA):22692}  Homicidal Thoughts:  {ST/HT (PAA):22692}  Memory:  {BHH MEMORY:22881}  Judgement:  {Judgement (PAA):22694}  Insight:  {Insight (PAA):22695}  Psychomotor Activity:  {Psychomotor (PAA):22696}  Concentration:  {Concentration:21399}  Recall:  {BHH GOOD/FAIR/POOR:22877}  Fund of Knowledge:{BHH GOOD/FAIR/POOR:22877}   Language: {BHH GOOD/FAIR/POOR:22877}  Akathisia:  {BHH YES OR NO:22294}  Handed:  {Handed:22697}  AIMS (if indicated):  {Desc; done/not:10129}  Assets:  {Assets (PAA):22698}  ADL's:  {BHH UMP'N:36144}  Cognition: {chl bhh cognition:304700322}  Sleep:  {BHH GOOD/FAIR/POOR:22877}   Screenings:   Assessment and Plan:    Plan  The patient demonstrates the following risk factors for suicide: Chronic risk factors for suicide include: {Chronic Risk Factors for RXVQMGQ:67619509}. Acute risk factors for suicide include: {Acute Risk Factors for TOIZTIW:58099833}. Protective factors for this patient include: {Protective Factors for Suicide ASNK:53976734}. Considering these factors, the overall suicide risk at this point appears to be {Desc; low/moderate/high:110033}. Patient {ACTION; IS/IS LPF:79024097} appropriate for outpatient follow up.   Neysa Hotter, MD 3/16/20219:51 AM

## 2019-10-04 ENCOUNTER — Other Ambulatory Visit: Payer: Self-pay

## 2019-10-04 ENCOUNTER — Telehealth (HOSPITAL_COMMUNITY): Payer: Self-pay | Admitting: Psychiatry

## 2019-10-04 ENCOUNTER — Ambulatory Visit (HOSPITAL_COMMUNITY): Payer: Medicaid Other | Admitting: Psychiatry

## 2019-10-04 NOTE — Telephone Encounter (Signed)
Sent a link to the number provided to the font desk [phone]). No sign in. Contacted this number, and Ms. [name] at facility answered the phone. She was not able to log into doxy despite trying for 20 mins. Will reschedule her appointment. Ms. [name]/facility will contact the front desk to provide a number so that we can do a video visit.

## 2019-10-11 ENCOUNTER — Ambulatory Visit (HOSPITAL_COMMUNITY)
Admission: RE | Admit: 2019-10-11 | Discharge: 2019-10-11 | Disposition: A | Discharge status: Home / Self Care | Payer: Medicaid Other | Source: Ambulatory Visit | Attending: Adult Health | Admitting: Adult Health

## 2019-10-11 ENCOUNTER — Other Ambulatory Visit: Payer: Self-pay

## 2019-10-11 DIAGNOSIS — R928 Other abnormal and inconclusive findings on diagnostic imaging of breast: Secondary | ICD-10-CM

## 2019-10-24 NOTE — Progress Notes (Signed)
Psychiatric Initial Adult Assessment   Patient Identification: Terri Bauer MRN:  [id number] Date of Evaluation:  10/27/2019 Referral Source: Dr. [name] Chief Complaint:   Chief Complaint    Psychiatric Evaluation; Other     Visit Diagnosis:    ICD-10-CM   1. Intellectual disability  F79     History of Present Illness:   Terri Bauer is a 54 y.o. year old female with a history of intellectual disability, hypertension, diabetes,  who is referred for intellectual disability.   She is relatively a poor historian, and provides very brief answers.  She states that she has been doing good.  She is at the current group home for 2-1/2 years.  She used to live with her mother for 21 years.  Her mother deceased "long time ago (a few years ago)" and she came to the group home. She meets with her maternal aunt monthly. She reports good relationship with her aunt.  She does not go outside otherwise nor take a walk as there are  "mean dogs." She has "no hobbies," although she later states that she enjoys listening to music.  She denies feeling depressed.  She denies anxiety.  She sleeps well.  She has good appetite.  She denies AH, VH.  She denies paranoia.  She denies ideas of reference.   Ms. Thomas at the [facility] presents to the interview.  Ms. Maisie Fus states that one of her co workers advised Terri Bauer to change her psychiatrist. Fallen was admitted to the hospital due to frequent falls in February.  Haldol injection was discontinued since then.  The [facility] has not noticed any change in Terri Bauer. Terri Bauer may meet with her brother with schizophrenia at times, who stays at other group home. She has been doing very well. No behavioral concern or safety concern.   Medication- olanzapine 10 mg at night  Associated Signs/Symptoms: Depression Symptoms:  denies (Hypo) Manic Symptoms:  denies decreased need for sleep, euphoria, irritability Anxiety Symptoms:   denies Psychotic Symptoms:  denies AH, VH, paranoia PTSD Symptoms: NA  Past Psychiatric History:  Outpatient: [facility]. Geanie Cooley at [facility] Psychiatry admission:  Denies  Previous suicide attempt: denies Past trials of medication: does not recollect History of violence:   Previous Psychotropic Medications: Yes   Substance Abuse History in the last 12 months:  No.  Consequences of Substance Abuse: NA  Past Medical History:  Past Medical History:  Diagnosis Date  . Bronchitis   . Diabetes ([facility])   . Hypertension   . Intellectual delay   . Other intellectual disabilities    sees psychiatrist  . Poor historian    limited due to intellectual disabilities.    Past Surgical History:  Procedure Laterality Date  . COLONOSCOPY WITH PROPOFOL N/A 10/09/2015   Procedure: COLONOSCOPY WITH PROPOFOL;  Surgeon: West Bali, MD;  [facility];  Service: Endoscopy;  Laterality: N/A;  1100-moved to 845 per Ginger  . none      Family Psychiatric History:  As below  Family History:  Family History  Problem Relation Age of Onset  . Colon cancer Father        diagnosed in his 11s, deceased   . Diabetes Brother   . Schizophrenia Brother     Social History:   Social History   Socioeconomic History  . Marital status: Single    Spouse name: Not on file  . Number of children: Not on file  . Years of education: Not on  file  . Highest education level: Not on file  Occupational History  . Not on file  Tobacco Use  . Smoking status: Never Smoker  . Smokeless tobacco: Never Used  Substance and Sexual Activity  . Alcohol use: No    Alcohol/week: 0.0 standard drinks  . Drug use: No  . Sexual activity: Not Currently    Birth control/protection: None  Other Topics Concern  . Not on file  Social History Narrative   Lives with mother. Has a psychiatrist and states "I don't know why I see them".    Social Determinants of Health   Financial Resource Strain:   .  Difficulty of Paying Living Expenses:   Food Insecurity:   . Worried About Charity fundraiser in the Last Year:   . Arboriculturist in the Last Year:   Transportation Needs:   . Film/video editor (Medical):   Marland Kitchen Lack of Transportation (Non-Medical):   Physical Activity:   . Days of Exercise per Week:   . Minutes of Exercise per Session:   Stress:   . Feeling of Stress :   Social Connections:   . Frequency of Communication with Friends and Family:   . Frequency of Social Gatherings with Friends and Family:   . Attends Religious Services:   . Active Member of Clubs or Organizations:   . Attends Archivist Meetings:   Marland Kitchen Marital Status:     Additional Social History:  Work: used to work at [employer] center for [employer]" for 30 years. The center was closed.  Education: 12 th grade, did have special aid   Allergies:  No Known Allergies  Metabolic Disorder Labs: Lab Results  Component Value Date   HGBA1C 5.8 (H) 06/09/2016   MPG 120 06/09/2016   No results found for: PROLACTIN Lab Results  Component Value Date   CHOL 196 06/09/2016   TRIG 147 06/09/2016   HDL 49 06/09/2016   CHOLHDL 4.0 06/09/2016   VLDL 29 06/09/2016   LDLCALC 118 (H) 06/09/2016   No results found for: TSH  Therapeutic Level Labs: No results found for: LITHIUM No results found for: CBMZ No results found for: VALPROATE  Current Medications: Current Outpatient Medications  Medication Sig Dispense Refill  . amLODipine (NORVASC) 10 MG tablet Take 10 mg by mouth daily.    . benztropine (COGENTIN) 2 MG tablet Take 2 mg by mouth daily.    Ruthe Mannan 200-5 MCG/ACT AERO SMARTSIG:2 Puff(s) By Mouth Twice Daily    . ferrous sulfate 325 (65 FE) MG tablet Take 325 mg by mouth 2 (two) times daily with a meal.    . losartan-hydrochlorothiazide (HYZAAR) 50-12.5 MG tablet Take 1 tablet by mouth daily.    . metFORMIN (GLUCOPHAGE) 500 MG tablet Take 500 mg by mouth 2 (two) times daily with a meal.     . OLANZapine (ZYPREXA) 10 MG tablet Take 10 mg by mouth at bedtime.    Marland Kitchen omeprazole (PRILOSEC) 20 MG capsule TAKE ONE CAPSULE BY MOUTH TWICE DAILY FOR 3 MONTHS THEN 1 CAPSULE EVERY DAY 60 capsule 3  . potassium chloride SA (KLOR-CON) 20 MEQ tablet Take 1 tablet (20 mEq total) by mouth daily. 3 tablet 0  . fluticasone (FLONASE) 50 MCG/ACT nasal spray Place 1 spray into both nostrils daily.    Marland Kitchen ketoconazole (NIZORAL) 2 % cream APPLY TO AFFECTED AREAVTWICE A DAY.I     No current facility-administered medications for this visit.    Musculoskeletal:  Strength & Muscle Tone: N/A Gait & Station: N/A Patient leans: N/A  Psychiatric Specialty Exam: Review of Systems  Psychiatric/Behavioral: Negative for agitation, behavioral problems, confusion, decreased concentration, dysphoric mood, hallucinations, self-injury and sleep disturbance. The patient is not nervous/anxious and is not hyperactive.   All other systems reviewed and are negative.   There were no vitals taken for this visit.There is no height or weight on file to calculate BMI.  General Appearance: Fairly Groomed  Eye Contact:  Good  Speech:  Clear and Coherent  Volume:  Normal  Mood:  "good"  Affect:  Appropriate, Congruent and Restricted  Thought Process:  Linear  Orientation:  Full (Time, Place, and Person)  Thought Content:  denies paranoia  Suicidal Thoughts:  No  Homicidal Thoughts:  No  Memory:  Immediate;   Good  Judgement:  Fair  Insight:  Shallow  Psychomotor Activity:  Normal  Concentration:  Concentration: Good and Attention Span: Good  Recall:  Good  Fund of Knowledge:Fair  Language: Good  Akathisia:  No  Handed:  Right  AIMS (if indicated):  not done  Assets:  Social Support  ADL's:  Intact  Cognition: WNL  Sleep:  Good   Screenings:   Assessment and Plan:  Terri Bauer is a 54 y.o. year old female with a history of intellectual disability, hypertension, diabetes,  who is referred for  intellectual disability.   # Intellectual disability # History of schizophrenia per report Exam is notable for concrete thought process, and she is pleasant and calm throughout the entire interview.  She denies any mood symptoms or psychotic symptoms.  The staff at the group home denies any safety or behavioral concerns nor any change since Haldol injection was discontinued in February.  Will continue current medication regimen at this time until we obtain more collateral from her previous psychiatrist.  We will continue olanzapine to target history of schizophrenia. Discussed potential metabolic side effect and EPS. Will plan to obtain labs if she has not done annual visit by her PCP. Will continue benztropine for possible EPS. This medication may be tapered off in the future to avoid polypharmacy.   Plan 1. Olanzapine 10 mg at night (the staff declined refill) 2. Continue benztropine 2 mg daily  (the staff declined refill) 3. Next appointment: 5/25 at 3 PM for 30 mins, video [id number] 4. Obtain record from [facility], Dr. Sidonie Dickens     The patient demonstrates the following risk factors for suicide: Chronic risk factors for suicide include: psychiatric disorder of intellectual disability. Acute risk factors for suicide include: unemployment. Protective factors for this patient include: positive social support. Considering these factors, the overall suicide risk at this point appears to be low. Patient is appropriate for outpatient follow up.   Neysa Hotter, MD 4/15/20213:30 PM

## 2019-10-27 ENCOUNTER — Ambulatory Visit (INDEPENDENT_AMBULATORY_CARE_PROVIDER_SITE_OTHER): Payer: Medicaid Other | Admitting: Psychiatry

## 2019-10-27 ENCOUNTER — Other Ambulatory Visit: Payer: Self-pay

## 2019-10-27 ENCOUNTER — Encounter (HOSPITAL_COMMUNITY): Payer: Self-pay | Admitting: Psychiatry

## 2019-10-27 DIAGNOSIS — F79 Unspecified intellectual disabilities: Secondary | ICD-10-CM

## 2019-10-27 NOTE — Patient Instructions (Signed)
1. Olanzapine 10 mg at night  2. Continue benztropine 2 mg daily   3. Next appointment: 5/25 at 3 PM

## 2019-11-03 ENCOUNTER — Ambulatory Visit (INDEPENDENT_AMBULATORY_CARE_PROVIDER_SITE_OTHER): Payer: Medicaid Other | Admitting: General Surgery

## 2019-11-03 ENCOUNTER — Other Ambulatory Visit (HOSPITAL_COMMUNITY): Payer: Self-pay | Admitting: General Surgery

## 2019-11-03 ENCOUNTER — Other Ambulatory Visit: Payer: Self-pay

## 2019-11-03 ENCOUNTER — Encounter: Payer: Self-pay | Admitting: General Surgery

## 2019-11-03 VITALS — BP 140/78 | HR 83 | Temp 97.8°F | Resp 12 | Ht 61.0 in | Wt 255.0 lb

## 2019-11-03 DIAGNOSIS — N6022 Fibroadenosis of left breast: Secondary | ICD-10-CM

## 2019-11-03 NOTE — Progress Notes (Signed)
Terri Bauer; [id number]; 08-04-1965   HPI Patient is a 54 year old black female who resides in a group home who was referred to my care by Dr. Felecia Shelling for a left breast lesion.  This was diagnosed a year ago and she was found to have sclerosing adenosis and biopsy was remitted at that time.  Due to multiple factors, the patient now presents for left breast biopsy after needle localization.  Due to her mental status, she is unable to give a review of systems.  She denies any breast pain or noticing a lump in the breast.  She is unaware of any family history of breast cancer. Past Medical History:  Diagnosis Date  . Bronchitis   . Diabetes ([facility])   . Hypertension   . Intellectual delay   . Other intellectual disabilities    sees psychiatrist  . Poor historian    limited due to intellectual disabilities.    Past Surgical History:  Procedure Laterality Date  . COLONOSCOPY WITH PROPOFOL N/A 10/09/2015   Procedure: COLONOSCOPY WITH PROPOFOL;  Surgeon: West Bali, MD;  [facility];  Service: Endoscopy;  Laterality: N/A;  1100-moved to 845 per Ginger  . none      Family History  Problem Relation Age of Onset  . Colon cancer Father        diagnosed in his 9s, deceased   . Diabetes Brother   . Schizophrenia Brother     Current Outpatient Medications on File Prior to Visit  Medication Sig Dispense Refill  . amLODipine (NORVASC) 10 MG tablet Take 10 mg by mouth daily. (0800)    . benztropine (COGENTIN) 2 MG tablet Take 2 mg by mouth daily at 8 pm. (2000)    . DULERA 200-5 MCG/ACT AERO Inhale 2 puffs into the lungs in the morning and at bedtime. (0800 & 2000)    . fluticasone (FLONASE) 50 MCG/ACT nasal spray Place 1 spray into both nostrils daily. (0800)    . ketoconazole (NIZORAL) 2 % cream Apply 1 application topically 2 (two) times daily. (0800 & 2000)    . losartan-hydrochlorothiazide (HYZAAR) 50-12.5 MG tablet Take 1 tablet by mouth daily. (0800)    . metFORMIN  (GLUCOPHAGE) 500 MG tablet Take 500 mg by mouth 2 (two) times daily with a meal. (0800 & 1700)    . OLANZapine (ZYPREXA) 10 MG tablet Take 10 mg by mouth at bedtime. (2000)    . omeprazole (PRILOSEC) 20 MG capsule TAKE ONE CAPSULE BY MOUTH TWICE DAILY FOR 3 MONTHS THEN 1 CAPSULE EVERY DAY (Patient taking differently: Take 20 mg by mouth daily. (0800)) 60 capsule 3  . potassium chloride SA (KLOR-CON) 20 MEQ tablet Take 1 tablet (20 mEq total) by mouth daily. (Patient taking differently: Take 20 mEq by mouth daily. (0800)) 3 tablet 0   No current facility-administered medications on file prior to visit.    No Known Allergies  Social History   Substance and Sexual Activity  Alcohol Use No  . Alcohol/week: 0.0 standard drinks    Social History   Tobacco Use  Smoking Status Never Smoker  Smokeless Tobacco Never Used    Review of Systems  Unable to perform ROS: Mental acuity    Objective   Vitals:   11/03/19 1047  BP: 140/78  Pulse: 83  Resp: 12  Temp: 97.8 F (36.6 C)  SpO2: 98%    Physical Exam Vitals reviewed.  Constitutional:      Appearance: Normal appearance. She is obese. She  is not ill-appearing.  HENT:     Head: Normocephalic and atraumatic.  Cardiovascular:     Rate and Rhythm: Normal rate and regular rhythm.     Heart sounds: Normal heart sounds. No murmur. No friction rub. No gallop.   Pulmonary:     Effort: Pulmonary effort is normal. No respiratory distress.     Breath sounds: Normal breath sounds. No stridor. No wheezing, rhonchi or rales.  Skin:    General: Skin is warm and dry.  Neurological:     Mental Status: She is alert and oriented to person, place, and time.   Breast: No dominant mass, nipple discharge, or dimpling in either breast.  Axillas negative for palpable nodes.  Mammography and pathology reports reviewed  Assessment  Sclerosing adenosis, left breast Plan   Patient is scheduled for left breast biopsy after needle localization on  11/15/2019.  The risks and benefits of the procedure will be fully explained to the patient's aunt, who has power of attorney.  The patient was made aware of the need for the breast biopsy.  I will try to contact the aunt to get consent.  

## 2019-11-03 NOTE — H&P (Signed)
Terri Bauer; [id number]; 08-04-1965   HPI Patient is a 54 year old black female who resides in a group home who was referred to my care by Dr. Felecia Shelling for a left breast lesion.  This was diagnosed a year ago and she was found to have sclerosing adenosis and biopsy was remitted at that time.  Due to multiple factors, the patient now presents for left breast biopsy after needle localization.  Due to her mental status, she is unable to give a review of systems.  She denies any breast pain or noticing a lump in the breast.  She is unaware of any family history of breast cancer. Past Medical History:  Diagnosis Date  . Bronchitis   . Diabetes (HCC)   . Hypertension   . Intellectual delay   . Other intellectual disabilities    sees psychiatrist  . Poor historian    limited due to intellectual disabilities.    Past Surgical History:  Procedure Laterality Date  . COLONOSCOPY WITH PROPOFOL N/A 10/09/2015   Procedure: COLONOSCOPY WITH PROPOFOL;  Surgeon: West Bali, MD;  Location: [facility];  Service: Endoscopy;  Laterality: N/A;  1100-moved to 845 per Ginger  . none      Family History  Problem Relation Age of Onset  . Colon cancer Father        diagnosed in his 9s, deceased   . Diabetes Brother   . Schizophrenia Brother     Current Outpatient Medications on File Prior to Visit  Medication Sig Dispense Refill  . amLODipine (NORVASC) 10 MG tablet Take 10 mg by mouth daily. (0800)    . benztropine (COGENTIN) 2 MG tablet Take 2 mg by mouth daily at 8 pm. (2000)    . DULERA 200-5 MCG/ACT AERO Inhale 2 puffs into the lungs in the morning and at bedtime. (0800 & 2000)    . fluticasone (FLONASE) 50 MCG/ACT nasal spray Place 1 spray into both nostrils daily. (0800)    . ketoconazole (NIZORAL) 2 % cream Apply 1 application topically 2 (two) times daily. (0800 & 2000)    . losartan-hydrochlorothiazide (HYZAAR) 50-12.5 MG tablet Take 1 tablet by mouth daily. (0800)    . metFORMIN  (GLUCOPHAGE) 500 MG tablet Take 500 mg by mouth 2 (two) times daily with a meal. (0800 & 1700)    . OLANZapine (ZYPREXA) 10 MG tablet Take 10 mg by mouth at bedtime. (2000)    . omeprazole (PRILOSEC) 20 MG capsule TAKE ONE CAPSULE BY MOUTH TWICE DAILY FOR 3 MONTHS THEN 1 CAPSULE EVERY DAY (Patient taking differently: Take 20 mg by mouth daily. (0800)) 60 capsule 3  . potassium chloride SA (KLOR-CON) 20 MEQ tablet Take 1 tablet (20 mEq total) by mouth daily. (Patient taking differently: Take 20 mEq by mouth daily. (0800)) 3 tablet 0   No current facility-administered medications on file prior to visit.    No Known Allergies  Social History   Substance and Sexual Activity  Alcohol Use No  . Alcohol/week: 0.0 standard drinks    Social History   Tobacco Use  Smoking Status Never Smoker  Smokeless Tobacco Never Used    Review of Systems  Unable to perform ROS: Mental acuity    Objective   Vitals:   11/03/19 1047  BP: 140/78  Pulse: 83  Resp: 12  Temp: 97.8 F (36.6 C)  SpO2: 98%    Physical Exam Vitals reviewed.  Constitutional:      Appearance: Normal appearance. She is obese. She  is not ill-appearing.  HENT:     Head: Normocephalic and atraumatic.  Cardiovascular:     Rate and Rhythm: Normal rate and regular rhythm.     Heart sounds: Normal heart sounds. No murmur. No friction rub. No gallop.   Pulmonary:     Effort: Pulmonary effort is normal. No respiratory distress.     Breath sounds: Normal breath sounds. No stridor. No wheezing, rhonchi or rales.  Skin:    General: Skin is warm and dry.  Neurological:     Mental Status: She is alert and oriented to person, place, and time.   Breast: No dominant mass, nipple discharge, or dimpling in either breast.  Axillas negative for palpable nodes.  Mammography and pathology reports reviewed  Assessment  Sclerosing adenosis, left breast Plan   Patient is scheduled for left breast biopsy after needle localization on  11/15/2019.  The risks and benefits of the procedure will be fully explained to the patient's aunt, who has power of attorney.  The patient was made aware of the need for the breast biopsy.  I will try to contact the aunt to get consent.

## 2019-11-08 NOTE — Patient Instructions (Signed)
Terri Bauer  11/08/2019     @PREFPERIOPPHARMACY @   Your procedure is scheduled on  11/15/2019  Report to [facility] at 0630  A.M.  Call this number if you have problems the morning of surgery:  [phone]   Remember:  Do not eat or drink after midnight.                          Take these medicines the morning of surgery with A SIP OF WATER  Amlodipine, omeprazole.    Do not wear jewelry, make-up or nail polish.  Do not wear lotions, powders, or perfumes, or deodorant.  Do not shave 48 hours prior to surgery.  Men may shave face and neck.  Do not bring valuables to the hospital.  [facility] is not responsible for any belongings or valuables.  Contacts, dentures or bridgework may not be worn into surgery.  Leave your suitcase in the car.  After surgery it may be brought to your room.  For patients admitted to the hospital, discharge time will be determined by your treatment team.  Patients discharged the day of surgery will not be allowed to drive home.   Name and phone number of your driver:   family Special instructions:  DO NOT smoke the morning of your procedure.  Please read over the following fact sheets that you were given. Anesthesia Post-op Instructions and Care and Recovery After Surgery       Breast Biopsy, Care After These instructions give you information about caring for yourself after your procedure. Your doctor may also give you more specific instructions. Call your doctor if you have any problems or questions after your procedure. What can I expect after the procedure? After your procedure, it is common to have:  Bruising on your breast.  Numbness, tingling, or pain near your biopsy site. Follow these instructions at home: Medicines  Take over-the-counter and prescription medicines only as told by your doctor.  Do not drive for 24 hours if you were given a medicine to help you relax (sedative) during your procedure.  Do not  drink alcohol while taking pain medicine.  Do not drive or use heavy machinery while taking prescription pain medicine. Biopsy site care      Follow instructions from your doctor about how to take care of your cut from surgery (incision) or your puncture area. Make sure you: ? Wash your hands with soap and water before you change your bandage (dressing). If you cannot use soap and water, use hand sanitizer. ? Change your bandage as told by your doctor. ? Leave stitches (sutures), skin glue, or skin tape (adhesive strips) in place. They may need to stay in place for 2 weeks or longer. If tape strips get loose and curl up, you may trim the loose edges. Do not remove tape strips completely unless your doctor says it is okay.  If you have stitches, keep them dry when you take a bath or a shower.  Check your cut or puncture area every day for signs of infection. Check for: ? Redness, swelling, or pain. ? Fluid or blood. ? Warmth. ? Pus or a bad smell.  Protect the biopsy area. Do not let the area get bumped. Activity  If you had a cut during your procedure, avoid activities that could pull your cut open. These include: ? Stretching. ? Reaching over your head. ? Exercise. ? Sports. ?  Lifting anything that weighs more than 3 lb (1.4 kg).  Return to your normal activities as told by your doctor. Ask your doctor what activities are safe for you. Managing pain, stiffness, and swelling If told, put ice on the biopsy site to relieve swelling:  Put ice in a plastic bag.  Place a towel between your skin and the bag.  Leave the ice on for 20 minutes, 2-3 times a day. General instructions  Continue your normal diet.  Wear a good support bra for as long as told by your doctor.  Get checked for extra fluid around your lymph nodes (lymphedema) as often as told by your doctor.  Keep all follow-up visits as told by your doctor. This is important. Contact a doctor if:  You notice any of  the following at the biopsy site: ? More redness, swelling, or pain. ? More fluid or blood coming from the site. ? The site feels warm to the touch. ? Pus or a bad smell coming from the site. ? The site breaks open after the stitches or skin tape strips have been removed.  You have a rash.  You have a fever. Get help right away if:  You have more bleeding from the biopsy site. Get help right away if bleeding is more than a small spot.  You have trouble breathing.  You have red streaks around the biopsy site. Summary  After your procedure, it is common to have bruising, numbness, tingling, or pain near the biopsy site.  Do not drive or use heavy machinery while taking prescription pain medicine.  Wear a good support bra for as long as told by your doctor.  If you had a cut during your procedure, avoid activities that may pull the cut open. Ask your doctor what activities are safe for you. This information is not intended to replace advice given to you by your health care provider. Make sure you discuss any questions you have with your health care provider. Document Revised: 12/17/2017 Document Reviewed: 12/17/2017 Elsevier Patient Education  2020 [place] Anesthesia, Adult, Care After This sheet gives you information about how to care for yourself after your procedure. Your health care provider may also give you more specific instructions. If you have problems or questions, contact your health care provider. What can I expect after the procedure? After the procedure, the following side effects are common:  Pain or discomfort at the IV site.  Nausea.  Vomiting.  Sore throat.  Trouble concentrating.  Feeling cold or chills.  Weak or tired.  Sleepiness and fatigue.  Soreness and body aches. These side effects can affect parts of the body that were not involved in surgery. Follow these instructions at home:  For at least 24 hours after the  procedure:  Have a responsible adult stay with you. It is important to have someone help care for you until you are awake and alert.  Rest as needed.  Do not: ? Participate in activities in which you could fall or become injured. ? Drive. ? Use heavy machinery. ? Drink alcohol. ? Take sleeping pills or medicines that cause drowsiness. ? Make important decisions or sign legal documents. ? Take care of children on your own. Eating and drinking  Follow any instructions from your health care provider about eating or drinking restrictions.  When you feel hungry, start by eating small amounts of foods that are soft and easy to digest (bland), such as toast. Gradually return to your  regular diet.  Drink enough fluid to keep your urine pale yellow.  If you vomit, rehydrate by drinking water, juice, or clear broth. General instructions  If you have sleep apnea, surgery and certain medicines can increase your risk for breathing problems. Follow instructions from your health care provider about wearing your sleep device: ? Anytime you are sleeping, including during daytime naps. ? While taking prescription pain medicines, sleeping medicines, or medicines that make you drowsy.  Return to your normal activities as told by your health care provider. Ask your health care provider what activities are safe for you.  Take over-the-counter and prescription medicines only as told by your health care provider.  If you smoke, do not smoke without supervision.  Keep all follow-up visits as told by your health care provider. This is important. Contact a health care provider if:  You have nausea or vomiting that does not get better with medicine.  You cannot eat or drink without vomiting.  You have pain that does not get better with medicine.  You are unable to pass urine.  You develop a skin rash.  You have a fever.  You have redness around your IV site that gets worse. Get help right away  if:  You have difficulty breathing.  You have chest pain.  You have blood in your urine or stool, or you vomit blood. Summary  After the procedure, it is common to have a sore throat or nausea. It is also common to feel tired.  Have a responsible adult stay with you for the first 24 hours after general anesthesia. It is important to have someone help care for you until you are awake and alert.  When you feel hungry, start by eating small amounts of foods that are soft and easy to digest (bland), such as toast. Gradually return to your regular diet.  Drink enough fluid to keep your urine pale yellow.  Return to your normal activities as told by your health care provider. Ask your health care provider what activities are safe for you. This information is not intended to replace advice given to you by your health care provider. Make sure you discuss any questions you have with your health care provider. Document Revised: 07/03/2017 Document Reviewed: 02/13/2017 Elsevier Patient Education  2020 [employer]. How to Use Chlorhexidine for Bathing Chlorhexidine gluconate (CHG) is a germ-killing (antiseptic) solution that is used to clean the skin. It can get rid of the bacteria that normally live on the skin and can keep them away for about 24 hours. To clean your skin with CHG, you may be given:  A CHG solution to use in the shower or as part of a sponge bath.  A prepackaged cloth that contains CHG. Cleaning your skin with CHG may help lower the risk for infection:  While you are staying in the intensive care unit of the hospital.  If you have a vascular access, such as a central line, to provide short-term or long-term access to your veins.  If you have a catheter to drain urine from your bladder.  If you are on a ventilator. A ventilator is a machine that helps you breathe by moving air in and out of your lungs.  After surgery. What are the risks? Risks of using CHG include:  A  skin reaction.  Hearing loss, if CHG gets in your ears.  Eye injury, if CHG gets in your eyes and is not rinsed out.  The CHG product catching fire.  Make sure that you avoid smoking and flames after applying CHG to your skin. Do not use CHG:  If you have a chlorhexidine allergy or have previously reacted to chlorhexidine.  On babies younger than 48 months of age. How to use CHG solution  Use CHG only as told by your health care provider, and follow the instructions on the label.  Use the full amount of CHG as directed. Usually, this is one bottle. During a shower Follow these steps when using CHG solution during a shower (unless your health care provider gives you different instructions): 1. Start the shower. 2. Use your normal soap and shampoo to wash your face and hair. 3. Turn off the shower or move out of the shower stream. 4. Pour the CHG onto a clean washcloth. Do not use any type of brush or rough-edged sponge. 5. Starting at your neck, lather your body down to your toes. Make sure you follow these instructions: ? If you will be having surgery, pay special attention to the part of your body where you will be having surgery. Scrub this area for at least 1 minute. ? Do not use CHG on your head or face. If the solution gets into your ears or eyes, rinse them well with water. ? Avoid your genital area. ? Avoid any areas of skin that have broken skin, cuts, or scrapes. ? Scrub your back and under your arms. Make sure to wash skin folds. 6. Let the lather sit on your skin for 1-2 minutes or as long as told by your health care provider. 7. Thoroughly rinse your entire body in the shower. Make sure that all body creases and crevices are rinsed well. 8. Dry off with a clean towel. Do not put any substances on your body afterward--such as powder, lotion, or perfume--unless you are told to do so by your health care provider. Only use lotions that are recommended by the  manufacturer. 9. Put on clean clothes or pajamas. 10. If it is the night before your surgery, sleep in clean sheets.  During a sponge bath Follow these steps when using CHG solution during a sponge bath (unless your health care provider gives you different instructions): 1. Use your normal soap and shampoo to wash your face and hair. 2. Pour the CHG onto a clean washcloth. 3. Starting at your neck, lather your body down to your toes. Make sure you follow these instructions: ? If you will be having surgery, pay special attention to the part of your body where you will be having surgery. Scrub this area for at least 1 minute. ? Do not use CHG on your head or face. If the solution gets into your ears or eyes, rinse them well with water. ? Avoid your genital area. ? Avoid any areas of skin that have broken skin, cuts, or scrapes. ? Scrub your back and under your arms. Make sure to wash skin folds. 4. Let the lather sit on your skin for 1-2 minutes or as long as told by your health care provider. 5. Using a different clean, wet washcloth, thoroughly rinse your entire body. Make sure that all body creases and crevices are rinsed well. 6. Dry off with a clean towel. Do not put any substances on your body afterward--such as powder, lotion, or perfume--unless you are told to do so by your health care provider. Only use lotions that are recommended by the manufacturer. 7. Put on clean clothes or pajamas. 8. If it is the  night before your surgery, sleep in clean sheets. How to use CHG prepackaged cloths  Only use CHG cloths as told by your health care provider, and follow the instructions on the label.  Use the CHG cloth on clean, dry skin.  Do not use the CHG cloth on your head or face unless your health care provider tells you to.  When washing with the CHG cloth: ? Avoid your genital area. ? Avoid any areas of skin that have broken skin, cuts, or scrapes. Before surgery Follow these steps when  using a CHG cloth to clean before surgery (unless your health care provider gives you different instructions): 1. Using the CHG cloth, vigorously scrub the part of your body where you will be having surgery. Scrub using a back-and-forth motion for 3 minutes. The area on your body should be completely wet with CHG when you are done scrubbing. 2. Do not rinse. Discard the cloth and let the area air-dry. Do not put any substances on the area afterward, such as powder, lotion, or perfume. 3. Put on clean clothes or pajamas. 4. If it is the night before your surgery, sleep in clean sheets.  For general bathing Follow these steps when using CHG cloths for general bathing (unless your health care provider gives you different instructions). 1. Use a separate CHG cloth for each area of your body. Make sure you wash between any folds of skin and between your fingers and toes. Wash your body in the following order, switching to a new cloth after each step: ? The front of your neck, shoulders, and chest. ? Both of your arms, under your arms, and your hands. ? Your stomach and groin area, avoiding the genitals. ? Your right leg and foot. ? Your left leg and foot. ? The back of your neck, your back, and your buttocks. 2. Do not rinse. Discard the cloth and let the area air-dry. Do not put any substances on your body afterward--such as powder, lotion, or perfume--unless you are told to do so by your health care provider. Only use lotions that are recommended by the manufacturer. 3. Put on clean clothes or pajamas. Contact a health care provider if:  Your skin gets irritated after scrubbing.  You have questions about using your solution or cloth. Get help right away if:  Your eyes become very red or swollen.  Your eyes itch badly.  Your skin itches badly and is red or swollen.  Your hearing changes.  You have trouble seeing.  You have swelling or tingling in your mouth or throat.  You have  trouble breathing.  You swallow any chlorhexidine. Summary  Chlorhexidine gluconate (CHG) is a germ-killing (antiseptic) solution that is used to clean the skin. Cleaning your skin with CHG may help to lower your risk for infection.  You may be given CHG to use for bathing. It may be in a bottle or in a prepackaged cloth to use on your skin. Carefully follow your health care provider's instructions and the instructions on the product label.  Do not use CHG if you have a chlorhexidine allergy.  Contact your health care provider if your skin gets irritated after scrubbing. This information is not intended to replace advice given to you by your health care provider. Make sure you discuss any questions you have with your health care provider. Document Revised: 09/16/2018 Document Reviewed: 05/28/2017 Elsevier Patient Education  2020 [employer].

## 2019-11-10 ENCOUNTER — Other Ambulatory Visit: Payer: Self-pay

## 2019-11-10 ENCOUNTER — Encounter (HOSPITAL_COMMUNITY): Payer: Self-pay

## 2019-11-10 ENCOUNTER — Encounter (HOSPITAL_COMMUNITY)
Admission: RE | Admit: 2019-11-10 | Discharge: 2019-11-10 | Disposition: A | Discharge status: Home / Self Care | Payer: Medicaid Other | Source: Ambulatory Visit | Attending: General Surgery | Admitting: General Surgery

## 2019-11-10 DIAGNOSIS — Z01818 Encounter for other preprocedural examination: Secondary | ICD-10-CM | POA: Insufficient documentation

## 2019-11-10 LAB — BASIC METABOLIC PANEL
Anion gap: 12 (ref 5–15)
BUN: 15 mg/dL (ref 6–20)
CO2: 27 mmol/L (ref 22–32)
Calcium: 9.2 mg/dL (ref 8.9–10.3)
Chloride: 102 mmol/L (ref 98–111)
Creatinine, Ser: 1.26 mg/dL — ABNORMAL HIGH (ref 0.44–1.00)
GFR calc Af Amer: 56 mL/min — ABNORMAL LOW (ref >60)
GFR calc non Af Amer: 48 mL/min — ABNORMAL LOW (ref >60)
Glucose, Bld: 126 mg/dL — ABNORMAL HIGH (ref 70–99)
Potassium: 3.7 mmol/L (ref 3.5–5.1)
Sodium: 141 mmol/L (ref 135–145)

## 2019-11-10 LAB — HCG, SERUM, QUALITATIVE: Preg, Serum: NEGATIVE

## 2019-11-10 LAB — HEMOGLOBIN A1C
Hgb A1c MFr Bld: 6.1 % — ABNORMAL HIGH (ref 4.8–5.6)
Mean Plasma Glucose: 128.37 mg/dL

## 2019-11-15 ENCOUNTER — Other Ambulatory Visit: Payer: Self-pay

## 2019-11-15 ENCOUNTER — Ambulatory Visit (HOSPITAL_COMMUNITY): Payer: Medicaid Other | Admitting: Anesthesiology

## 2019-11-15 ENCOUNTER — Encounter (HOSPITAL_COMMUNITY)
Admission: RE | Disposition: A | Discharge status: Home / Self Care | Payer: Self-pay | Source: Home / Self Care | Attending: General Surgery

## 2019-11-15 ENCOUNTER — Ambulatory Visit (HOSPITAL_COMMUNITY)
Admission: RE | Admit: 2019-11-15 | Discharge: 2019-11-15 | Disposition: A | Discharge status: Home / Self Care | Payer: Medicaid Other | Source: Ambulatory Visit | Attending: General Surgery | Admitting: General Surgery

## 2019-11-15 ENCOUNTER — Encounter (HOSPITAL_COMMUNITY): Payer: Self-pay | Admitting: General Surgery

## 2019-11-15 ENCOUNTER — Ambulatory Visit (HOSPITAL_COMMUNITY): Payer: Medicaid Other

## 2019-11-15 ENCOUNTER — Ambulatory Visit (HOSPITAL_COMMUNITY)
Admission: RE | Admit: 2019-11-15 | Discharge: 2019-11-15 | Disposition: A | Discharge status: Home / Self Care | Payer: Medicaid Other | Attending: General Surgery | Admitting: General Surgery

## 2019-11-15 DIAGNOSIS — E119 Type 2 diabetes mellitus without complications: Secondary | ICD-10-CM | POA: Insufficient documentation

## 2019-11-15 DIAGNOSIS — Z7984 Long term (current) use of oral hypoglycemic drugs: Secondary | ICD-10-CM | POA: Insufficient documentation

## 2019-11-15 DIAGNOSIS — N6022 Fibroadenosis of left breast: Secondary | ICD-10-CM

## 2019-11-15 DIAGNOSIS — I10 Essential (primary) hypertension: Secondary | ICD-10-CM | POA: Diagnosis not present

## 2019-11-15 DIAGNOSIS — Z20822 Contact with and (suspected) exposure to covid-19: Secondary | ICD-10-CM | POA: Diagnosis not present

## 2019-11-15 DIAGNOSIS — F79 Unspecified intellectual disabilities: Secondary | ICD-10-CM | POA: Diagnosis not present

## 2019-11-15 DIAGNOSIS — Z79899 Other long term (current) drug therapy: Secondary | ICD-10-CM | POA: Diagnosis not present

## 2019-11-15 HISTORY — PX: BREAST BIOPSY: SHX20

## 2019-11-15 LAB — GLUCOSE, CAPILLARY
Glucose-Capillary: 86 mg/dL (ref 70–99)
Glucose-Capillary: 122 mg/dL — ABNORMAL HIGH (ref 70–99)

## 2019-11-15 LAB — RESPIRATORY PANEL BY RT PCR (FLU A&B, COVID)
Influenza A by PCR: NEGATIVE
Influenza B by PCR: NEGATIVE
SARS Coronavirus 2 by RT PCR: NEGATIVE

## 2019-11-15 SURGERY — BREAST BIOPSY WITH NEEDLE LOCALIZATION
Anesthesia: General | Site: Breast | Laterality: Left

## 2019-11-15 MED ORDER — CHLORHEXIDINE GLUCONATE CLOTH 2 % EX PADS: 6.0000 | MEDICATED_PAD | Freq: Once | CUTANEOUS | Status: DC

## 2019-11-15 MED ORDER — FENTANYL CITRATE (PF) 100 MCG/2ML IJ SOLN
INTRAMUSCULAR | Status: DC | PRN
  Administered 2019-11-15 (×2): 50 ug via INTRAVENOUS

## 2019-11-15 MED ORDER — IPRATROPIUM-ALBUTEROL 0.5-2.5 (3) MG/3ML IN SOLN
RESPIRATORY_TRACT | Status: AC
  Filled 2019-11-15: qty 3

## 2019-11-15 MED ORDER — LIDOCAINE HCL (PF) 2 % IJ SOLN
INTRAMUSCULAR | Status: AC
  Filled 2019-11-15: qty 10

## 2019-11-15 MED ORDER — FENTANYL CITRATE (PF) 100 MCG/2ML IJ SOLN
INTRAMUSCULAR | Status: AC
  Filled 2019-11-15: qty 2

## 2019-11-15 MED ORDER — BUPIVACAINE HCL (PF) 0.5 % IJ SOLN
INTRAMUSCULAR | Status: AC
  Filled 2019-11-15: qty 30

## 2019-11-15 MED ORDER — CEFAZOLIN SODIUM-DEXTROSE 2-4 GM/100ML-% IV SOLN
2.0000 g | INTRAVENOUS | Status: AC
  Administered 2019-11-15: 09:00:00 2 g via INTRAVENOUS

## 2019-11-15 MED ORDER — ONDANSETRON HCL 4 MG/2ML IJ SOLN
INTRAMUSCULAR | Status: DC | PRN
  Administered 2019-11-15: 4 mg via INTRAVENOUS

## 2019-11-15 MED ORDER — ONDANSETRON HCL 4 MG/2ML IJ SOLN
INTRAMUSCULAR | Status: AC
  Filled 2019-11-15: qty 2

## 2019-11-15 MED ORDER — FENTANYL CITRATE (PF) 100 MCG/2ML IJ SOLN: 25.0000 ug | INTRAMUSCULAR | Status: DC | PRN

## 2019-11-15 MED ORDER — PHENYLEPHRINE HCL (PRESSORS) 10 MG/ML IV SOLN
INTRAVENOUS | Status: DC | PRN
Start: 2019-11-15 — End: 2019-11-15
  Administered 2019-11-15: 80 ug via INTRAVENOUS

## 2019-11-15 MED ORDER — KETOROLAC TROMETHAMINE 30 MG/ML IJ SOLN: 30.0000 mg | Freq: Once | INTRAMUSCULAR | Status: DC

## 2019-11-15 MED ORDER — IPRATROPIUM-ALBUTEROL 0.5-2.5 (3) MG/3ML IN SOLN
3.0000 mL | Freq: Once | RESPIRATORY_TRACT | Status: AC
  Administered 2019-11-15: 12:00:00 3 mL via RESPIRATORY_TRACT

## 2019-11-15 MED ORDER — BUPIVACAINE HCL (PF) 0.5 % IJ SOLN
INTRAMUSCULAR | Status: DC | PRN
  Administered 2019-11-15: 10 mL

## 2019-11-15 MED ORDER — LACTATED RINGERS IV SOLN: INTRAVENOUS | Status: DC

## 2019-11-15 MED ORDER — PHENYLEPHRINE 40 MCG/ML (10ML) SYRINGE FOR IV PUSH (FOR BLOOD PRESSURE SUPPORT)
PREFILLED_SYRINGE | INTRAVENOUS | Status: AC
  Filled 2019-11-15: qty 10

## 2019-11-15 MED ORDER — PROPOFOL 10 MG/ML IV BOLUS
INTRAVENOUS | Status: AC
  Filled 2019-11-15: qty 40

## 2019-11-15 MED ORDER — CEFAZOLIN SODIUM-DEXTROSE 2-4 GM/100ML-% IV SOLN
INTRAVENOUS | Status: AC
  Filled 2019-11-15: qty 100

## 2019-11-15 MED ORDER — ARTIFICIAL TEARS OPHTHALMIC OINT
TOPICAL_OINTMENT | OPHTHALMIC | Status: AC
  Filled 2019-11-15: qty 3.5

## 2019-11-15 MED ORDER — SEVOFLURANE IN SOLN
RESPIRATORY_TRACT | Status: AC
  Filled 2019-11-15: qty 250

## 2019-11-15 MED ORDER — 0.9 % SODIUM CHLORIDE (POUR BTL) OPTIME
TOPICAL | Status: DC | PRN
  Administered 2019-11-15: 10:00:00 1000 mL

## 2019-11-15 MED ORDER — PROPOFOL 10 MG/ML IV BOLUS
INTRAVENOUS | Status: DC | PRN
  Administered 2019-11-15: 200 mg via INTRAVENOUS

## 2019-11-15 SURGICAL SUPPLY — 31 items
BLADE SURG 15 STRL LF DISP TIS (BLADE) ×1 IMPLANT
BLADE SURG 15 STRL SS (BLADE) ×3
CHLORAPREP W/TINT 26 (MISCELLANEOUS) ×3 IMPLANT
CLOTH BEACON ORANGE TIMEOUT ST (SAFETY) ×3 IMPLANT
COVER LIGHT HANDLE STERIS (MISCELLANEOUS) ×6 IMPLANT
COVER WAND RF STERILE (DRAPES) ×3 IMPLANT
DECANTER SPIKE VIAL GLASS SM (MISCELLANEOUS) ×3 IMPLANT
DERMABOND ADVANCED (GAUZE/BANDAGES/DRESSINGS) ×2
DERMABOND ADVANCED .7 DNX12 (GAUZE/BANDAGES/DRESSINGS) ×1 IMPLANT
ELECT REM PT RETURN 9FT ADLT (ELECTROSURGICAL) ×3
ELECTRODE REM PT RTRN 9FT ADLT (ELECTROSURGICAL) ×1 IMPLANT
GLOVE BIO SURGEON STRL SZ 6.5 (GLOVE) ×2 IMPLANT
GLOVE BIO SURGEON STRL SZ7 (GLOVE) ×3 IMPLANT
GLOVE BIO SURGEONS STRL SZ 6.5 (GLOVE) ×1
GLOVE BIOGEL PI IND STRL 7.0 (GLOVE) ×2 IMPLANT
GLOVE BIOGEL PI INDICATOR 7.0 (GLOVE) ×4
GLOVE SURG SS PI 7.5 STRL IVOR (GLOVE) ×6 IMPLANT
GOWN STRL REUS W/ TWL LRG LVL3 (GOWN DISPOSABLE) ×1 IMPLANT
GOWN STRL REUS W/TWL LRG LVL3 (GOWN DISPOSABLE) ×12 IMPLANT
KIT TURNOVER KIT A (KITS) ×3 IMPLANT
MANIFOLD NEPTUNE II (INSTRUMENTS) ×3 IMPLANT
NEEDLE HYPO 25X1 1.5 SAFETY (NEEDLE) ×3 IMPLANT
NS IRRIG 1000ML POUR BTL (IV SOLUTION) ×3 IMPLANT
PACK MINOR (CUSTOM PROCEDURE TRAY) ×3 IMPLANT
PAD ARMBOARD 7.5X6 YLW CONV (MISCELLANEOUS) ×3 IMPLANT
SET BASIN LINEN APH (SET/KITS/TRAYS/PACK) ×3 IMPLANT
SUT MNCRL AB 4-0 PS2 18 (SUTURE) ×3 IMPLANT
SUT SILK 2 0 SH (SUTURE) ×3 IMPLANT
SUT VIC AB 3-0 SH 27 (SUTURE) ×3
SUT VIC AB 3-0 SH 27X BRD (SUTURE) ×1 IMPLANT
SYR CONTROL 10ML LL (SYRINGE) ×3 IMPLANT

## 2019-11-15 NOTE — Anesthesia Postprocedure Evaluation (Signed)
Anesthesia Post Note  Patient: Terri Bauer  Procedure(s) Performed: BREAST BIOPSY WITH NEEDLE LOCALIZATION (Left Breast)  Patient location during evaluation: PACU Anesthesia Type: General Level of consciousness: awake and alert and patient cooperative Pain management: satisfactory to patient Vital Signs Assessment: post-procedure vital signs reviewed and stable Respiratory status: spontaneous breathing Cardiovascular status: stable Postop Assessment: no apparent nausea or vomiting Anesthetic complications: no     Last Vitals:  Vitals:   11/15/19 1030 11/15/19 1045  BP: (!) 99/52 112/76  Pulse: 78 85  Resp: 16 18  Temp:    SpO2: 100% (!) 88%    Last Pain:  Vitals:   11/15/19 1018  PainSc: 0-No pain                 Evertt Chouinard

## 2019-11-15 NOTE — Progress Notes (Signed)
Oxygen saturation  Reading 75 upon arrival from PACU.  Encouraged patient to deep breathe and cough - used incentive spirometer   - oxyen saturation up to 90..Saturation stayed between 75-80 without deep breathing and coughing.  Lungs clear to auscultation.  Dr. Emeterio Reeve informed and evaluated patient - advised Duoneb breathing treatment.  Gave pave patient Duoneb and oxygen saturation up to 100%.  Helped patient get dressed and checked oxygen saturation before discharge - O2 saturation at 96%. Encouraged patient and her aunt to use incentive spirometer and deep breathing and coughing  at home.  Included spirometer and instructions with discharge packet.

## 2019-11-15 NOTE — Anesthesia Procedure Notes (Signed)
Procedure Name: LMA Insertion Date/Time: 11/15/2019 9:26 AM Performed by: Franco Nones, CRNA Pre-anesthesia Checklist: Patient identified, Patient being monitored, Emergency Drugs available, Timeout performed and Suction available Patient Re-evaluated:Patient Re-evaluated prior to induction Oxygen Delivery Method: Circle System Utilized Preoxygenation: Pre-oxygenation with 100% oxygen Induction Type: IV induction LMA: LMA inserted LMA Size: 4.0 Number of attempts: 1 Placement Confirmation: positive ETCO2 and breath sounds checked- equal and bilateral Tube secured with: Tape Dental Injury: Teeth and Oropharynx as per pre-operative assessment

## 2019-11-15 NOTE — Anesthesia Preprocedure Evaluation (Addendum)
Anesthesia Evaluation  Patient identified by MRN, date of birth, ID band Patient awake    Reviewed: Allergy & Precautions, H&P , NPO status , Patient's Chart, lab work & pertinent test results, reviewed documented beta blocker date and time   Airway Mallampati: III  TM Distance: >3 FB Neck ROM: full  Mouth opening: Limited Mouth Opening  Dental no notable dental hx.    Pulmonary neg pulmonary ROS,    Pulmonary exam normal breath sounds clear to auscultation       Cardiovascular Exercise Tolerance: Good hypertension, negative cardio ROS   Rhythm:regular Rate:Normal     Neuro/Psych PSYCHIATRIC DISORDERS negative neurological ROS     GI/Hepatic negative GI ROS, Neg liver ROS,   Endo/Other  negative endocrine ROSdiabetes, Well Controlled, Type 2  Renal/GU negative Renal ROS  negative genitourinary   Musculoskeletal   Abdominal   Peds  Hematology negative hematology ROS (+)   Anesthesia Other Findings   Reproductive/Obstetrics negative OB ROS                            Anesthesia Physical Anesthesia Plan  ASA: III  Anesthesia Plan: General   Post-op Pain Management:    Induction:   PONV Risk Score and Plan:   Airway Management Planned:   Additional Equipment:   Intra-op Plan:   Post-operative Plan:   Informed Consent: I have reviewed the patients History and Physical, chart, labs and discussed the procedure including the risks, benefits and alternatives for the proposed anesthesia with the patient or authorized representative who has indicated his/her understanding and acceptance.     Dental Advisory Given  Plan Discussed with: CRNA  Anesthesia Plan Comments:        Anesthesia Quick Evaluation

## 2019-11-15 NOTE — Discharge Instructions (Signed)
Breast Biopsy, Care After This sheet gives you information about how to care for yourself after your procedure. Your health care provider may also give you more specific instructions. If you have problems or questions, contact your health care provider. What can I expect after the procedure? After your procedure, it is common to have:  Bruising on your breast.  Numbness, tingling, or pain near your biopsy site. Follow these instructions at home: Medicines  Take over-the-counter and prescription medicines only as told by your health care provider.  Do not drive for 24 hours if you were given a sedative during your procedure.  Do not drink alcohol while taking pain medicine.  Do not drive or use heavy machinery while taking prescription pain medicine. Biopsy site care      Follow instructions from your health care provider about how to take care of your incision or puncture site. Make sure you: ? Wash your hands with soap and water before you change your bandage (dressing). If soap and water are not available, use hand sanitizer. ? Change your dressing as told by your health care provider. ? Leave any stitches (sutures), skin glue, or adhesive strips in place. These skin closures may need to stay in place for 2 weeks or longer. If adhesive strip edges start to loosen and curl up, you may trim the loose edges. Do not remove adhesive strips completely unless your health care provider tells you to do that.  If you have sutures, keep them dry when bathing.  Check your incision or puncture area every day for signs of infection. Check for: ? Redness, swelling, or pain. ? Fluid or blood. ? Warmth. ? Pus or a bad smell.  Protect the biopsy area. Do not let the area get bumped. Activity  If you had an incision during your procedure, avoid activities that may pull the incision site open. Avoid stretching, reaching above your head, exercise, sports, or lifting anything that is heavier than  3 lb (1.4 kg).  Return to your normal activities as told by your health care provider. Ask your health care provider what activities are safe for you. Managing pain  If directed, put ice on the biopsy site to relieve tenderness: ? Put ice in a plastic bag. ? Place a towel between your skin and the bag. ? Leave the ice on for 20 minutes, 2-3 times a day. General instructions  Resume your usual diet.  Wear a good support bra for as long as told by your health care provider.  Get checked for extra fluid around your lymph nodes (lymphedema) as often as told by your health care provider.  Keep all follow-up visits as told by your health care provider. This is important. Contact a health care provider if:  You have more redness, swelling, or pain at the biopsy site.  You have more fluid or blood coming from your biopsy site.  Your biopsy site feels warm to the touch.  You have pus or a bad smell coming from the biopsy site.  Your biopsy site breaks open after the sutures or adhesive strips have been removed.  You have a rash.  You have a fever. Get help right away if you have:  Increased bleeding (more than a small spot) from the biopsy site.  Difficulty breathing.  Red streaks around the biopsy site. Summary  After your procedure, it is common to have bruising, numbness, tingling, or pain near the biopsy site.  Do not drive or use heavy machinery  while taking prescription pain medicine.  Wear a good support bra for as long as told by your health care provider.  If you had an incision during your procedure, avoid activities that may pull the incision site open. Ask your health care provider what activities are safe for you. This information is not intended to replace advice given to you by your health care provider. Make sure you discuss any questions you have with your health care provider. Document Revised: 12/16/2017 Document Reviewed: 12/16/2017 Elsevier Patient  Education  2020 [employer]    Wound Care, Adult Taking care of your wound properly can help to prevent pain, infection, and scarring. It can also help your wound to heal more quickly. How to care for your wound Wound care      Follow instructions from your health care provider about how to take care of your wound. Make sure you: ? Wash your hands with soap and water before you change the bandage (dressing). If soap and water are not available, use hand sanitizer. ? Change your dressing as told by your health care provider. ? Leave stitches (sutures), skin glue, or adhesive strips in place. These skin closures may need to stay in place for 2 weeks or longer. If adhesive strip edges start to loosen and curl up, you may trim the loose edges. Do not remove adhesive strips completely unless your health care provider tells you to do that.  Check your wound area every day for signs of infection. Check for: ? Redness, swelling, or pain. ? Fluid or blood. ? Warmth. ? Pus or a bad smell.  Ask your health care provider if you should clean the wound with mild soap and water. Doing this may include: ? Using a clean towel to pat the wound dry after cleaning it. Do not rub or scrub the wound. ? Applying a cream or ointment. Do this only as told by your health care provider. ? Covering the incision with a clean dressing.  Ask your health care provider when you can leave the wound uncovered.  Keep the dressing dry until your health care provider says it can be removed. Do not take baths, swim, use a hot tub, or do anything that would put the wound underwater until your health care provider approves. Ask your health care provider if you can take showers. You may only be allowed to take sponge baths. Medicines   If you were prescribed an antibiotic medicine, cream, or ointment, take or use the antibiotic as told by your health care provider. Do not stop taking or using the antibiotic even if your  condition improves.  Take over-the-counter and prescription medicines only as told by your health care provider. If you were prescribed pain medicine, take it 30 or more minutes before you do any wound care or as told by your health care provider. General instructions  Return to your normal activities as told by your health care provider. Ask your health care provider what activities are safe.  Do not scratch or pick at the wound.  Do not use any products that contain nicotine or tobacco, such as cigarettes and e-cigarettes. These may delay wound healing. If you need help quitting, ask your health care provider.  Keep all follow-up visits as told by your health care provider. This is important.  Eat a diet that includes protein, vitamin A, vitamin C, and other nutrient-rich foods to help the wound heal. ? Foods rich in protein include meat, dairy, beans, nuts,  and other sources. ? Foods rich in vitamin A include carrots and dark green, leafy vegetables. ? Foods rich in vitamin C include citrus, tomatoes, and other fruits and vegetables. ? Nutrient-rich foods have protein, carbohydrates, fat, vitamins, or minerals. Eat a variety of healthy foods including vegetables, fruits, and whole grains. Contact a health care provider if:  You received a tetanus shot and you have swelling, severe pain, redness, or bleeding at the injection site.  Your pain is not controlled with medicine.  You have redness, swelling, or pain around the wound.  You have fluid or blood coming from the wound.  Your wound feels warm to the touch.  You have pus or a bad smell coming from the wound.  You have a fever or chills.  You are nauseous or you vomit.  You are dizzy. Get help right away if:  You have a red streak going away from your wound.  The edges of the wound open up and separate.  Your wound is bleeding, and the bleeding does not stop with gentle pressure.  You have a rash.  You  faint.  You have trouble breathing. Summary  Always wash your hands with soap and water before changing your bandage (dressing).  To help with healing, eat foods that are rich in protein, vitamin A, vitamin C, and other nutrients.  Check your wound every day for signs of infection. Contact your health care provider if you suspect that your wound is infected. This information is not intended to replace advice given to you by your health care provider. Make sure you discuss any questions you have with your health care provider. Document Revised: 10/18/2018 Document Reviewed: 01/15/2016 Elsevier Patient Education  2020 [employer]  PATIENT INSTRUCTIONS POST-ANESTHESIA  IMMEDIATELY FOLLOWING SURGERY:  Do not drive or operate machinery for the first twenty four hours after surgery.  Do not make any important decisions for twenty four hours after surgery or while taking narcotic pain medications or sedatives.  If you develop intractable nausea and vomiting or a severe headache please notify your doctor immediately.  FOLLOW-UP:  Please make an appointment with your surgeon as instructed. You do not need to follow up with anesthesia unless specifically instructed to do so.  WOUND CARE INSTRUCTIONS (if applicable):  Keep a dry clean dressing on the anesthesia/puncture wound site if there is drainage.  Once the wound has quit draining you may leave it open to air.  Generally you should leave the bandage intact for twenty four hours unless there is drainage.  If the epidural site drains for more than 36-48 hours please call the anesthesia department.  QUESTIONS?:  Please feel free to call your physician or the hospital operator if you have any questions, and they will be happy to assist you.      How to Use an Incentive Spirometer An incentive spirometer is a tool that measures how well you are filling your lungs with each breath. Learning to take long, deep breaths using this tool can help you  keep your lungs clear and active. This may help to reverse or lessen your chance of developing breathing (pulmonary) problems, especially infection. You may be asked to use a spirometer:  After a surgery.  If you have a lung problem or a history of smoking.  After a long period of time when you have been unable to move or be active. If the spirometer includes an indicator to show the highest number that you have reached, your health  care provider or respiratory therapist will help you set a goal. Keep a list (log) of your progress as told by your health care provider. What are the risks?  Breathing too quickly may cause dizziness or cause you to pass out. Take your time so you do not get dizzy or light-headed.  If you are in pain, you may need to take pain medicine before doing incentive spirometry. It is harder to take a deep breath if you are having pain. How to use your incentive spirometer  1. Sit up on the edge of your bed or on a chair. 2. Hold the incentive spirometer so that it is in an upright position. 3. Before you use the spirometer, breathe out normally. 4. Place the mouthpiece in your mouth. Make sure your lips are closed tightly around it. 5. Breathe in slowly and as deeply as you can through your mouth, causing the piston or the ball to rise toward the top of the chamber. 6. Hold your breath for 3-5 seconds, or for as long as possible. ? If the spirometer includes a coach indicator, use this to guide you in breathing. Slow down your breathing if the indicator goes above the marked areas. 7. Remove the mouthpiece from your mouth and breathe out normally. The piston or ball will return to the bottom of the chamber. 8. Rest for a few seconds, then repeat the steps 10 or more times. ? Take your time and take a few normal breaths between deep breaths so that you do not get dizzy or light-headed. ? Do this every 1-2 hours when you are awake. 9. If the spirometer includes a goal  marker to show the highest number you have reached (best effort), use this as a goal to work toward during each repetition. 10. After each set of 10 deep breaths, cough a few times. This will help to make sure that your lungs are clear. ? If you have an incision on your chest or abdomen from surgery, place a pillow or a rolled-up towel firmly against the incision when you cough. This can help to reduce pain from coughing. General tips  When you become able to get out of bed, walk around often and continue to cough to help clear your lungs.  Keep using the incentive spirometer until your health care provider says it is okay to stop using it. If you have been in the hospital, you may be told to keep using the spirometer at home. Contact a health care provider if:  You are having difficulty using the spirometer.  You have trouble using the spirometer as often as instructed.  Your pain medicine is not giving enough relief for you to use the spirometer as told.  You have a fever.  You develop shortness of breath. Get help right away if:  You develop a cough with bloody mucus from the lungs (bloody sputum).  You have fluid or blood coming from an incision site after you cough. Summary  An incentive spirometer is a tool that can help you learn to take long, deep breaths to keep your lungs clear and active.  You may be asked to use a spirometer after a surgery, if you have a lung problem or a history of smoking, or if you have been inactive for a long period of time.  Use your incentive spirometer as instructed every 1-2 hours while you are awake.  If you have an incision on your chest or abdomen, place a pillow or  a rolled-up towel firmly against your incision when you cough. This will help to reduce pain. This information is not intended to replace advice given to you by your health care provider. Make sure you discuss any questions you have with your health care provider. Document  Revised: 01/28/2019 Document Reviewed: 05/13/2017 Elsevier Patient Education  2020 [employer].

## 2019-11-15 NOTE — Op Note (Signed)
Patient:  Terri Bauer  DOB:  01-Oct-1965  MRN:  [id number]   Preop Diagnosis: Sclerosing adenosis, left breast  Postop Diagnosis: Same, pathology pending  Procedure: Left breast biopsy after needle localization  Surgeon: Franky Macho, MD  Anes: General  Indications: Patient is a 54 year old black female who was found on core biopsy of the left breast to have a sclerosing adenosis neoplasm.  The patient now presents for formal excision.  The risks and benefits of the procedure including bleeding and infection were fully explained to the patient and the aunt, who gave informed consent for the patient as the patient has mental deficiencies and she is power of attorney.  Procedure note: The patient had undergone needle localization in the radiology department in the left breast.  After general anesthesia was administered, the left breast was prepped and draped using the usual sterile technique with ChloraPrep.  Surgical site confirmation was performed.  The wire was located in the upper, outer quadrant of the left breast.  An incision was made and the wire was included within the incision.  The dissection was taken down to the area of suspicion.  This was excised using Bovie electrocautery.  A short suture was placed superiorly and a long suture placed laterally for orientation purposes.  Specimen radiography revealed the clip of the suspicious lesion to be within the specimen removed.  It was then sent to pathology for further examination.  A bleeding was controlled using Bovie electrocautery.  0.5% Sensorcaine was instilled into the surrounding wound.  The subcutaneous layer was reapproximated using a 3-0 Vicryl interrupted suture.  The skin was closed using a 4-0 Monocryl subcuticular suture.  Dermabond was applied.  All tape and needle counts were correct at the end of the procedure.  The patient was awakened and transferred to PACU in stable condition.  Complications: None  EBL:  Minimal  Specimen: Left breast biopsy

## 2019-11-15 NOTE — Transfer of Care (Signed)
Immediate Anesthesia Transfer of Care Note  Patient: Terri Bauer  Procedure(s) Performed: BREAST BIOPSY WITH NEEDLE LOCALIZATION (Left Breast)  Patient Location: PACU  Anesthesia Type:General  Level of Consciousness: awake and patient cooperative  Airway & Oxygen Therapy: Patient Spontanous Breathing and non-rebreather face mask  Post-op Assessment: Report given to RN and Post -op Vital signs reviewed and stable  Post vital signs: Reviewed and stable  Last Vitals:  Vitals Value Taken Time  BP    Temp    Pulse    Resp    SpO2      Last Pain:  Vitals:   11/15/19 0808  PainSc: (P) 0-No pain      Patients Stated Pain Goal: (P) 6 (11/15/19 9447)  Complications: No apparent anesthesia complications

## 2019-11-15 NOTE — Interval H&P Note (Signed)
History and Physical Interval Note:  11/15/2019 8:50 AM  Terri Bauer  has presented today for surgery, with the diagnosis of Sclerosing adenosis.  The various methods of treatment have been discussed with the patient and family. After consideration of risks, benefits and other options for treatment, the patient has consented to  Procedure(s): BREAST BIOPSY WITH NEEDLE LOCALIZATION (Left) as a surgical intervention.  The patient's history has been reviewed, patient examined, no change in status, stable for surgery.  I have reviewed the patient's chart and labs.  Questions were answered to the patient's satisfaction.     Franky Macho

## 2019-11-18 ENCOUNTER — Telehealth (INDEPENDENT_AMBULATORY_CARE_PROVIDER_SITE_OTHER): Payer: Self-pay | Admitting: General Surgery

## 2019-11-18 DIAGNOSIS — Z09 Encounter for follow-up examination after completed treatment for conditions other than malignant neoplasm: Secondary | ICD-10-CM

## 2019-11-18 LAB — SURGICAL PATHOLOGY

## 2019-11-18 NOTE — Telephone Encounter (Signed)
Pathology results told to [facility], patient's power of attorney.  No malignancy seen.  Patient will follow up with me as needed.  She is being followed at a group home.

## 2019-11-29 ENCOUNTER — Encounter (HOSPITAL_COMMUNITY): Payer: Medicaid Other

## 2019-11-30 NOTE — Progress Notes (Signed)
Virtual Visit via Video Note  I connected with Terri Bauer on 12/06/19 at  3:00 PM EDT by a video enabled telemedicine application and verified that I am speaking with the correct person using two identifiers.   I discussed the limitations of evaluation and management by telemedicine and the availability of in person appointments. The patient expressed understanding and agreed to proceed.   I discussed the assessment and treatment plan with the patient. The patient was provided an opportunity to ask questions and all were answered. The patient agreed with the plan and demonstrated an understanding of the instructions.   The patient was advised to call back or seek an in-person evaluation if the symptoms worsen or if the condition fails to improve as anticipated.  Location: patient-  [facility], provider- office   I provided 12 minutes of non-face-to-face time during this encounter.   Neysa Hotter, MD      [facility] MD/PA/NP OP Progress Note  12/06/2019 3:30 PM Terri Bauer  MRN:  [id number]  Chief Complaint:  Chief Complaint    Schizophrenia; Other     HPI:  This is a follow-up appointment for schizophrenia and EPS.  She states that she has been doing good.  When she is asked about daily routine, she states that she watches TV, "that is all." When inquired, she reports that she has a friend, named Quarry manager. She enjoys going outside or listening to music. She denies any concern. She denies paranoia.  She denies ideas of reference.  She denies AH, VH.  She denies feeling depressed or anxiety.  She denies SI, HI. She denies insomnia. She has good appetite.  Terri Bauer at [facility] family care presents to the interview.  There is no concern about Sala since the last visit. Para March is controlling [place] to help [place]. Staffs are trying to intervene this behavior. No obvious movement issues or behavior/safety concerns. She takes medication regularly.   Visit  Diagnosis:    ICD-10-CM   1. Intellectual disability  F79 Lipid panel    Basic Metabolic Panel (BMET)    CANCELED: TSH  2. Extrapyramidal symptom  R29.818     Past Psychiatric History: Please see initial evaluation for full details. I have reviewed the history. No updates at this time.     Past Medical History:  Past Medical History:  Diagnosis Date  . Bronchitis   . Diabetes (HCC)   . Hypertension   . Intellectual delay   . Other intellectual disabilities    sees psychiatrist  . Poor historian    limited due to intellectual disabilities.    Past Surgical History:  Procedure Laterality Date  . BREAST BIOPSY Left 11/15/2019   Procedure: BREAST BIOPSY WITH NEEDLE LOCALIZATION;  Surgeon: Franky Macho, MD;  Location: [facility];  Service: General;  Laterality: Left;  . COLONOSCOPY WITH PROPOFOL N/A 10/09/2015   Procedure: COLONOSCOPY WITH PROPOFOL;  Surgeon: West Bali, MD;  Location: [facility];  Service: Endoscopy;  Laterality: N/A;  1100-moved to 845 per Ginger  . none      Family Psychiatric History: Please see initial evaluation for full details. I have reviewed the history. No updates at this time.     Family History:  Family History  Problem Relation Age of Onset  . Colon cancer Father        diagnosed in his 48s, deceased   . Diabetes Brother   . Schizophrenia Brother     Social History:  Social History  Socioeconomic History  . Marital status: Single    Spouse name: Not on file  . Number of children: Not on file  . Years of education: Not on file  . Highest education level: Not on file  Occupational History  . Not on file  Tobacco Use  . Smoking status: Never Smoker  . Smokeless tobacco: Never Used  Substance and Sexual Activity  . Alcohol use: No    Alcohol/week: 0.0 standard drinks  . Drug use: No  . Sexual activity: Not Currently    Birth control/protection: None  Other Topics Concern  . Not on file  Social History Narrative   Lives with  mother. Has a psychiatrist and states "I don't know why I see them".    Social Determinants of Health   Financial Resource Strain:   . Difficulty of Paying Living Expenses:   Food Insecurity:   . Worried About Programme researcher, broadcasting/film/video in the Last Year:   . Barista in the Last Year:   Transportation Needs:   . Freight forwarder (Medical):   Marland Kitchen Lack of Transportation (Non-Medical):   Physical Activity:   . Days of Exercise per Week:   . Minutes of Exercise per Session:   Stress:   . Feeling of Stress :   Social Connections:   . Frequency of Communication with Friends and Family:   . Frequency of Social Gatherings with Friends and Family:   . Attends Religious Services:   . Active Member of Clubs or Organizations:   . Attends Banker Meetings:   Marland Kitchen Marital Status:     Allergies: No Known Allergies  Metabolic Disorder Labs: Lab Results  Component Value Date   HGBA1C 6.1 (H) 11/10/2019   MPG 128.37 11/10/2019   MPG 120 06/09/2016   No results found for: PROLACTIN Lab Results  Component Value Date   CHOL 196 06/09/2016   TRIG 147 06/09/2016   HDL 49 06/09/2016   CHOLHDL 4.0 06/09/2016   VLDL 29 06/09/2016   LDLCALC 118 (H) 06/09/2016   No results found for: TSH  Therapeutic Level Labs: No results found for: LITHIUM No results found for: VALPROATE No components found for:  CBMZ  Current Medications: Current Outpatient Medications  Medication Sig Dispense Refill  . amLODipine (NORVASC) 10 MG tablet Take 10 mg by mouth daily. (0800)    . benztropine (COGENTIN) 1 MG tablet Take 1 tablet (1 mg total) by mouth daily at 8 pm. 90 tablet 0  . DULERA 200-5 MCG/ACT AERO Inhale 2 puffs into the lungs in the morning and at bedtime. (0800 & 2000)    . ferrous sulfate 325 (65 FE) MG tablet Take 325 mg by mouth in the morning and at bedtime. (0800 & 2000)    . fluticasone (FLONASE) 50 MCG/ACT nasal spray Place 1 spray into both nostrils daily. (0800)    .  ibuprofen (ADVIL) 600 MG tablet Take 600 mg by mouth every 6 (six) hours as needed (for pain.).    Marland Kitchen ketoconazole (NIZORAL) 2 % cream Apply 1 application topically 2 (two) times daily. (0800 & 2000)    . losartan-hydrochlorothiazide (HYZAAR) 50-12.5 MG tablet Take 1 tablet by mouth daily. (0800)    . metFORMIN (GLUCOPHAGE) 500 MG tablet Take 500 mg by mouth 2 (two) times daily with a meal. (0800 & 1700)    . OLANZapine (ZYPREXA) 10 MG tablet Take 10 mg by mouth at bedtime. (2000)    . OLANZapine (ZYPREXA)  7.5 MG tablet Take 1 tablet (7.5 mg total) by mouth at bedtime. 90 tablet 0  . omeprazole (PRILOSEC) 20 MG capsule TAKE ONE CAPSULE BY MOUTH TWICE DAILY FOR 3 MONTHS THEN 1 CAPSULE EVERY DAY (Patient taking differently: Take 20 mg by mouth daily. (0800)) 60 capsule 3  . potassium chloride SA (KLOR-CON) 20 MEQ tablet Take 1 tablet (20 mEq total) by mouth daily. (Patient taking differently: Take 20 mEq by mouth daily. (0800)) 3 tablet 0   No current facility-administered medications for this visit.     Musculoskeletal: Strength & Muscle Tone: N/A Gait & Station: N/A Patient leans: N/A  Psychiatric Specialty Exam: Review of Systems  There were no vitals taken for this visit.There is no height or weight on file to calculate BMI.  General Appearance: Fairly Groomed  Eye Contact:  Good  Speech:  Clear and Coherent  Volume:  Normal  Mood:  good  Affect:  Appropriate, Congruent and euthymic  Thought Process:  Coherent, concrete  Orientation:  Full (Time, Place, and Person)  Thought Content: Logical   Suicidal Thoughts:  No  Homicidal Thoughts:  No  Memory:  Immediate;   Good  Judgement:  Fair  Insight:  Present  Psychomotor Activity:  Normal  Concentration:  Concentration: Good and Attention Span: Good  Recall:  Good  Fund of Knowledge: Good  Language: Good  Akathisia:  No  Handed:  Right  AIMS (if indicated): not done  Assets:  Communication Skills Desire for Improvement   ADL's:  Intact  Cognition: WNL  Sleep:  Good   Screenings:   Assessment and Plan:  Shamika Pedregon is a 54 y.o. year old female with a history of intellectual disability, hypertension, diabetes , who presents for follow up appointment for Intellectual disability - Plan: Lipid panel, Basic Metabolic Panel (BMET), CANCELED: TSH  Extrapyramidal symptom   1. Intellectual disability # History of schizophrenia per report Exam is notable for concrete thought process, and she is calm, cooperative through the exam, which has been consistent since the initial interview.  There has been no significant mood symptoms or psychotic symptoms.  Will continue olanzapine at lower dose to target schizophrenia, which has been reported in the past.  The staff is advised to contact the office if any worsening in her symptoms.  Will obtain labs to rule out metabolic side effect.    2. Extrapyramidal symptom She has no apparent EPS on exam.  We will taper down this medication, with plan to taper it off in the future to avoid polypharmacy.    Plan 1. Decrease Olanzapine 7.5 mg at night  2. Decrease benztropine 1 mg daily   3. Next appointment: 7/13 at 2:30 for 30 mins, video [phone] [name] 4. Obtain blood test at [facility]. lipid panels. Glucose, BMP 4. Obtain record from [facility], Dr. Basilio Cairo  - pending   The patient demonstrates the following risk factors for suicide: Chronic risk factors for suicide include: psychiatric disorder of intellectual disability. Acute risk factors for suicide include: unemployment. Protective factors for this patient include: positive social support. Considering these factors, the overall suicide risk at this point appears to be low. Patient is appropriate for outpatient follow up.  Norman Clay, MD 12/06/2019, 3:30 PM

## 2019-12-06 ENCOUNTER — Encounter (HOSPITAL_COMMUNITY): Payer: Self-pay | Admitting: Psychiatry

## 2019-12-06 ENCOUNTER — Other Ambulatory Visit: Payer: Self-pay

## 2019-12-06 ENCOUNTER — Telehealth (INDEPENDENT_AMBULATORY_CARE_PROVIDER_SITE_OTHER): Payer: Medicaid Other | Admitting: Psychiatry

## 2019-12-06 DIAGNOSIS — R29818 Other symptoms and signs involving the nervous system: Secondary | ICD-10-CM

## 2019-12-06 DIAGNOSIS — F79 Unspecified intellectual disabilities: Secondary | ICD-10-CM

## 2019-12-06 MED ORDER — OLANZAPINE 7.5 MG PO TABS
7.5000 mg | ORAL_TABLET | Freq: Every day | ORAL | 0 refills | Status: DC
Start: 2019-12-06 — End: 2020-02-06

## 2019-12-06 MED ORDER — BENZTROPINE MESYLATE 1 MG PO TABS: 1.0000 mg | ORAL_TABLET | Freq: Every day | ORAL | 0 refills | Status: DC

## 2019-12-06 NOTE — Patient Instructions (Signed)
1. Decrease Olanzapine 7.5 mg at night  2. Decrease benztropine 1 mg daily   3. Next appointment: 7/13 at 2:30  4. Obtain blood test at [facility]. lipid panels. Glucose, BMP

## 2019-12-07 ENCOUNTER — Telehealth (HOSPITAL_COMMUNITY): Payer: Self-pay | Admitting: *Deleted

## 2019-12-07 NOTE — Telephone Encounter (Signed)
Jasmine December with [facility] called to inform provider that patient last visit with Dr. Felecia Shelling was October 20, 2019. [phone] or cell is [phone]. Per Jasmine December, did you want patient to get blood work and if so can it be faxed to [facility] at [phone]. Please call Jasmine December with with response.

## 2019-12-07 NOTE — Telephone Encounter (Signed)
Scripts from epic was faxed to Terri Bauer fax at [phone]

## 2019-12-07 NOTE — Telephone Encounter (Signed)
I believe they wanted lab to be sent to [facility], and I ordered it yesterday in the system. If they need it to be printed, please mail it to them.

## 2019-12-07 NOTE — Telephone Encounter (Signed)
If they prefer fax, please fax it to them

## 2019-12-07 NOTE — Telephone Encounter (Signed)
Per previous message Terri Bauer was asking for patient labs order to be faxed to practice administrator fax number. Do you still want me to mail the order that is for [facility] to the address on file for patient?

## 2020-01-18 NOTE — Progress Notes (Deleted)
[facility] MD/PA/NP OP Progress Note  01/18/2020 4:38 PM Terri Bauer  MRN:  [id number]  Chief Complaint:  HPI: *** Visit Diagnosis: No diagnosis found.  Past Psychiatric History: Please see initial evaluation for full details. I have reviewed the history. No updates at this time.     Past Medical History:  Past Medical History:  Diagnosis Date  . Bronchitis   . Diabetes (HCC)   . Hypertension   . Intellectual delay   . Other intellectual disabilities    sees psychiatrist  . Poor historian    limited due to intellectual disabilities.    Past Surgical History:  Procedure Laterality Date  . BREAST BIOPSY Left 11/15/2019   Procedure: BREAST BIOPSY WITH NEEDLE LOCALIZATION;  Surgeon: Franky Macho, MD;  Location: [facility];  Service: General;  Laterality: Left;  . COLONOSCOPY WITH PROPOFOL N/A 10/09/2015   Procedure: COLONOSCOPY WITH PROPOFOL;  Surgeon: West Bali, MD;  Location: [facility];  Service: Endoscopy;  Laterality: N/A;  1100-moved to 845 per Ginger  . none      Family Psychiatric History: Please see initial evaluation for full details. I have reviewed the history. No updates at this time.     Family History:  Family History  Problem Relation Age of Onset  . Colon cancer Father        diagnosed in his 48s, deceased   . Diabetes Brother   . Schizophrenia Brother     Social History:  Social History   Socioeconomic History  . Marital status: Single    Spouse name: Not on file  . Number of children: Not on file  . Years of education: Not on file  . Highest education level: Not on file  Occupational History  . Not on file  Tobacco Use  . Smoking status: Never Smoker  . Smokeless tobacco: Never Used  Vaping Use  . Vaping Use: Former  Substance and Sexual Activity  . Alcohol use: No    Alcohol/week: 0.0 standard drinks  . Drug use: No  . Sexual activity: Not Currently    Birth control/protection: None  Other Topics Concern  . Not on file  Social  History Narrative   Lives with mother. Has a psychiatrist and states "I don't know why I see them".    Social Determinants of Health   Financial Resource Strain:   . Difficulty of Paying Living Expenses:   Food Insecurity:   . Worried About Programme researcher, broadcasting/film/video in the Last Year:   . Barista in the Last Year:   Transportation Needs:   . Freight forwarder (Medical):   Marland Kitchen Lack of Transportation (Non-Medical):   Physical Activity:   . Days of Exercise per Week:   . Minutes of Exercise per Session:   Stress:   . Feeling of Stress :   Social Connections:   . Frequency of Communication with Friends and Family:   . Frequency of Social Gatherings with Friends and Family:   . Attends Religious Services:   . Active Member of Clubs or Organizations:   . Attends Banker Meetings:   Marland Kitchen Marital Status:     Allergies: No Known Allergies  Metabolic Disorder Labs: Lab Results  Component Value Date   HGBA1C 6.1 (H) 11/10/2019   MPG 128.37 11/10/2019   MPG 120 06/09/2016   No results found for: PROLACTIN Lab Results  Component Value Date   CHOL 196 06/09/2016   TRIG 147 06/09/2016  HDL 49 06/09/2016   CHOLHDL 4.0 06/09/2016   VLDL 29 06/09/2016   LDLCALC 118 (H) 06/09/2016   No results found for: TSH  Therapeutic Level Labs: No results found for: LITHIUM No results found for: VALPROATE No components found for:  CBMZ  Current Medications: Current Outpatient Medications  Medication Sig Dispense Refill  . amLODipine (NORVASC) 10 MG tablet Take 10 mg by mouth daily. (0800)    . benztropine (COGENTIN) 1 MG tablet Take 1 tablet (1 mg total) by mouth daily at 8 pm. 90 tablet 0  . DULERA 200-5 MCG/ACT AERO Inhale 2 puffs into the lungs in the morning and at bedtime. (0800 & 2000)    . ferrous sulfate 325 (65 FE) MG tablet Take 325 mg by mouth in the morning and at bedtime. (0800 & 2000)    . fluticasone (FLONASE) 50 MCG/ACT nasal spray Place 1 spray into both  nostrils daily. (0800)    . ibuprofen (ADVIL) 600 MG tablet Take 600 mg by mouth every 6 (six) hours as needed (for pain.).    Marland Kitchen ketoconazole (NIZORAL) 2 % cream Apply 1 application topically 2 (two) times daily. (0800 & 2000)    . losartan-hydrochlorothiazide (HYZAAR) 50-12.5 MG tablet Take 1 tablet by mouth daily. (0800)    . metFORMIN (GLUCOPHAGE) 500 MG tablet Take 500 mg by mouth 2 (two) times daily with a meal. (0800 & 1700)    . OLANZapine (ZYPREXA) 10 MG tablet Take 10 mg by mouth at bedtime. (2000)    . OLANZapine (ZYPREXA) 7.5 MG tablet Take 1 tablet (7.5 mg total) by mouth at bedtime. 90 tablet 0  . omeprazole (PRILOSEC) 20 MG capsule TAKE ONE CAPSULE BY MOUTH TWICE DAILY FOR 3 MONTHS THEN 1 CAPSULE EVERY DAY (Patient taking differently: Take 20 mg by mouth daily. (0800)) 60 capsule 3  . potassium chloride SA (KLOR-CON) 20 MEQ tablet Take 1 tablet (20 mEq total) by mouth daily. (Patient taking differently: Take 20 mEq by mouth daily. (0800)) 3 tablet 0   No current facility-administered medications for this visit.     Musculoskeletal: Strength & Muscle Tone: N/A Gait & Station: N/A Patient leans: N/A  Psychiatric Specialty Exam: Review of Systems  There were no vitals taken for this visit.There is no height or weight on file to calculate BMI.  General Appearance: {Appearance:22683}  Eye Contact:  {BHH EYE CONTACT:22684}  Speech:  Clear and Coherent  Volume:  Normal  Mood:  {BHH MOOD:22306}  Affect:  {Affect (PAA):22687}  Thought Process:  Coherent  Orientation:  Full (Time, Place, and Person)  Thought Content: Logical   Suicidal Thoughts:  {ST/HT (PAA):22692}  Homicidal Thoughts:  {ST/HT (PAA):22692}  Memory:  Immediate;   Good  Judgement:  {Judgement (PAA):22694}  Insight:  {Insight (PAA):22695}  Psychomotor Activity:  Normal  Concentration:  Concentration: Good and Attention Span: Good  Recall:  Good  Fund of Knowledge: Good  Language: Good  Akathisia:  No   Handed:  Right  AIMS (if indicated): not done  Assets:  Communication Skills Desire for Improvement  ADL's:  Intact  Cognition: WNL  Sleep:  {BHH GOOD/FAIR/POOR:22877}   Screenings:   Assessment and Plan:  Terri Bauer is a 54 y.o. year old female with a history of , who presents for follow up appointment for below.          Terri Bauer is a 54 y.o. year old female with a history of intellectual disability, hypertension, diabetes , who presents for follow  up appointment for Intellectual disability - Plan: Lipid panel, Basic Metabolic Panel (BMET), CANCELED: TSH  Extrapyramidal symptom   1. Intellectual disability # History of schizophrenia per report Exam is notable for concrete thought process, and she is calm, cooperative through the exam, which has been consistent since the initial interview.  There has been no significant mood symptoms or psychotic symptoms.  Will continue olanzapine at lower dose to target schizophrenia, which has been reported in the past.  The staff is advised to contact the office if any worsening in her symptoms.  Will obtain labs to rule out metabolic side effect.    2. Extrapyramidal symptom She has no apparent EPS on exam.  We will taper down this medication, with plan to taper it off in the future to avoid polypharmacy.    Plan 1. Decrease Olanzapine 7.5 mg at night  2. Decrease benztropine 1 mg daily  3. Next appointment: 7/13 at 2:30 for 30 mins, video [phone] [name] 4. Obtain blood test at [facility]. lipid panels. Glucose, BMP 4. Obtain record from [facility], Dr. Sidonie Dickens- pending   The patient demonstrates the following risk factors for suicide: Chronic risk factors for suicide include:psychiatric disorder ofintellectual disability. Acute risk factorsfor suicide include:unemployment. Protective factorsfor this patient include: positive social support. Considering these factors, the overall suicide risk at this  point appears to below. Patientisappropriate for outpatient follow up.   Neysa Hotter, MD 01/18/2020, 4:38 PM

## 2020-01-24 ENCOUNTER — Telehealth (HOSPITAL_COMMUNITY): Payer: Medicaid Other | Admitting: Psychiatry

## 2020-01-31 NOTE — Progress Notes (Addendum)
Virtual Visit via Telephone Note  I connected with Terri Bauer on 02/06/20 at 12:00 PM EDT by telephone and verified that I am speaking with the correct person using two identifiers.   I discussed the limitations, risks, security and privacy concerns of performing an evaluation and management service by telephone and the availability of in person appointments. I also discussed with the patient that there may be a patient responsible charge related to this service. The patient expressed understanding and agreed to proceed.     I discussed the assessment and treatment plan with the patient. The patient was provided an opportunity to ask questions and all were answered. The patient agreed with the plan and demonstrated an understanding of the instructions.   The patient was advised to call back or seek an in-person evaluation if the symptoms worsen or if the condition fails to improve as anticipated.  Location: patient- home, provider- office   I provided 12 minutes of non-face-to-face time during this encounter.   Neysa Hotter, MD    [facility] MD/PA/NP OP Progress Note  02/06/2020 12:19 PM Terri Bauer  MRN:  [id number]  Chief Complaint:  Chief Complaint    Follow-up; Other     HPI:  This is a follow-up appointment for intellectual disability.  She will and states that she has been doing well.  She reports good relationship with Janet/her friend.  She denies any concern.  She sleeps well.  She is well.  She denies feeling depressed or anxious.  She denies paranoia.  She denies AH, VH.   The staff at [facility] presents to the interview.  [name] has been doing very well. No significant change since the last visit. No behavior or safety concern. She takes medication regularly.    Visit Diagnosis:    ICD-10-CM   1. Intellectual disability  F79     Past Psychiatric History: Please see initial evaluation for full details. I have reviewed the history. No updates at  this time.     Past Medical History:  Past Medical History:  Diagnosis Date  . Bronchitis   . Diabetes (HCC)   . Hypertension   . Intellectual delay   . Other intellectual disabilities    sees psychiatrist  . Poor historian    limited due to intellectual disabilities.    Past Surgical History:  Procedure Laterality Date  . BREAST BIOPSY Left 11/15/2019   Procedure: BREAST BIOPSY WITH NEEDLE LOCALIZATION;  Surgeon: Franky Macho, MD;  Location: [facility] ORS;  Service: General;  Laterality: Left;  . COLONOSCOPY WITH PROPOFOL N/A 10/09/2015   Procedure: COLONOSCOPY WITH PROPOFOL;  Surgeon: West Bali, MD;  Location: [facility] ENDO SUITE;  Service: Endoscopy;  Laterality: N/A;  1100-moved to 845 per Ginger  . none      Family Psychiatric History: Please see initial evaluation for full details. I have reviewed the history. No updates at this time.     Family History:  Family History  Problem Relation Age of Onset  . Colon cancer Father        diagnosed in his 49s, deceased   . Diabetes Brother   . Schizophrenia Brother     Social History:  Social History   Socioeconomic History  . Marital status: Single    Spouse name: Not on file  . Number of children: Not on file  . Years of education: Not on file  . Highest education level: Not on file  Occupational History  . Not on file  Tobacco Use  . Smoking status: Never Smoker  . Smokeless tobacco: Never Used  Vaping Use  . Vaping Use: Former  Substance and Sexual Activity  . Alcohol use: No    Alcohol/week: 0.0 standard drinks  . Drug use: No  . Sexual activity: Not Currently    Birth control/protection: None  Other Topics Concern  . Not on file  Social History Narrative   Lives with mother. Has a psychiatrist and states "I don't know why I see them".    Social Determinants of Health   Financial Resource Strain:   . Difficulty of Paying Living Expenses:   Food Insecurity:   . Worried About Programme researcher, broadcasting/film/video in the Last  Year:   . Barista in the Last Year:   Transportation Needs:   . Freight forwarder (Medical):   Marland Kitchen Lack of Transportation (Non-Medical):   Physical Activity:   . Days of Exercise per Week:   . Minutes of Exercise per Session:   Stress:   . Feeling of Stress :   Social Connections:   . Frequency of Communication with Friends and Family:   . Frequency of Social Gatherings with Friends and Family:   . Attends Religious Services:   . Active Member of Clubs or Organizations:   . Attends Banker Meetings:   Marland Kitchen Marital Status:     Allergies: No Known Allergies  Metabolic Disorder Labs: Lab Results  Component Value Date   HGBA1C 6.1 (H) 11/10/2019   MPG 128.37 11/10/2019   MPG 120 06/09/2016   No results found for: PROLACTIN Lab Results  Component Value Date   CHOL 196 06/09/2016   TRIG 147 06/09/2016   HDL 49 06/09/2016   CHOLHDL 4.0 06/09/2016   VLDL 29 06/09/2016   LDLCALC 118 (H) 06/09/2016   No results found for: TSH  Therapeutic Level Labs: No results found for: LITHIUM No results found for: VALPROATE No components found for:  CBMZ  Current Medications: Current Outpatient Medications  Medication Sig Dispense Refill  . amLODipine (NORVASC) 10 MG tablet Take 10 mg by mouth daily. (0800)    . benztropine (COGENTIN) 1 MG tablet Take 1 tablet (1 mg total) by mouth daily at 8 pm. 90 tablet 0  . DULERA 200-5 MCG/ACT AERO Inhale 2 puffs into the lungs in the morning and at bedtime. (0800 & 2000)    . ferrous sulfate 325 (65 FE) MG tablet Take 325 mg by mouth in the morning and at bedtime. (0800 & 2000)    . fluticasone (FLONASE) 50 MCG/ACT nasal spray Place 1 spray into both nostrils daily. (0800)    . ibuprofen (ADVIL) 600 MG tablet Take 600 mg by mouth every 6 (six) hours as needed (for pain.).    Marland Kitchen ketoconazole (NIZORAL) 2 % cream Apply 1 application topically 2 (two) times daily. (0800 & 2000)    . losartan-hydrochlorothiazide (HYZAAR) 50-12.5  MG tablet Take 1 tablet by mouth daily. (0800)    . metFORMIN (GLUCOPHAGE) 500 MG tablet Take 500 mg by mouth 2 (two) times daily with a meal. (0800 & 1700)    . [START ON 03/06/2020] OLANZapine (ZYPREXA) 7.5 MG tablet Take 1 tablet (7.5 mg total) by mouth at bedtime. 90 tablet 0  . omeprazole (PRILOSEC) 20 MG capsule TAKE ONE CAPSULE BY MOUTH TWICE DAILY FOR 3 MONTHS THEN 1 CAPSULE EVERY DAY (Patient taking differently: Take 20 mg by mouth daily. (0800)) 60 capsule 3  . potassium  chloride SA (KLOR-CON) 20 MEQ tablet Take 1 tablet (20 mEq total) by mouth daily. (Patient taking differently: Take 20 mEq by mouth daily. (0800)) 3 tablet 0   No current facility-administered medications for this visit.     Musculoskeletal: Strength & Muscle Tone: N/A Gait & Station: N/A Patient leans: N/A  Psychiatric Specialty Exam: Review of Systems  Psychiatric/Behavioral: Negative for agitation, behavioral problems, confusion, decreased concentration, dysphoric mood, hallucinations, self-injury, sleep disturbance and suicidal ideas. The patient is not nervous/anxious and is not hyperactive.   All other systems reviewed and are negative.   There were no vitals taken for this visit.There is no height or weight on file to calculate BMI.  General Appearance: NA  Eye Contact:  NA  Speech:  Clear and Coherent  Volume:  Normal  Mood:  good  Affect:  NA  Thought Process:  Coherent  Orientation:  Full (Time, Place, and Person)  Thought Content: Logical   Suicidal Thoughts:  No  Homicidal Thoughts:  No  Memory:  Immediate;   Good  Judgement:  Good  Insight:  Present  Psychomotor Activity:  Normal  Concentration:  Concentration: Good and Attention Span: Good  Recall:  Good  Fund of Knowledge: Good  Language: Good  Akathisia:  No  Handed:  Right  AIMS (if indicated): not done  Assets:  Communication Skills Desire for Improvement  ADL's:  Intact  Cognition: WNL  Sleep:  Good    Screenings:   Assessment and Plan:  Swayzie Choate is a 54 y.o. year old female with a history of  intellectual disability, hypertension, diabetes, who presents for follow up appointment for below.    1. Intellectual disability # History of schizophrenia per report Exam is notable for concrete thought process, and she is cooperative through the exam, which has been consistent since initial.  There has been no change in her behavior/personality since tapering down olanzapine.  We will continue the current dose to target behavioral issues associated with intellectual disability,  although this medication may be tapered down in the future.  Discussed potential metabolic side effect.  The staff is reminded to obtain labs for monitoring metabolic side effect.    2. Extrapyramidal symptom She has had no apparent EPS, and no change since tapering down benztropine.  Will discontinue this medication to avoid polypharmacy.   Plan 1. Continue Olanzapine 7.5 mg at night  2. Discontinue benztropine  3. Next appointment: 10/18 at 1 PM for 30 mins, video [name] [phone] 4. Obtain blood test at [facility]. lipid panels. Glucose, BMP 5. Obtain record from [facility], Dr. Sidonie Dickens- pending   The patient demonstrates the following risk factors for suicide: Chronic risk factors for suicide include:psychiatric disorder ofintellectual disability. Acute risk factorsfor suicide include:unemployment. Protective factorsfor this patient include: positive social support. Considering these factors, the overall suicide risk at this point appears to below. Patientisappropriate for outpatient follow up.  Neysa Hotter, MD 02/06/2020, 12:19 PM

## 2020-02-06 ENCOUNTER — Encounter (HOSPITAL_COMMUNITY): Payer: Self-pay | Admitting: Psychiatry

## 2020-02-06 ENCOUNTER — Other Ambulatory Visit: Payer: Self-pay

## 2020-02-06 ENCOUNTER — Telehealth (INDEPENDENT_AMBULATORY_CARE_PROVIDER_SITE_OTHER): Payer: Medicaid Other | Admitting: Psychiatry

## 2020-02-06 ENCOUNTER — Telehealth (HOSPITAL_COMMUNITY): Payer: Self-pay | Admitting: Psychiatry

## 2020-02-06 DIAGNOSIS — F79 Unspecified intellectual disabilities: Secondary | ICD-10-CM | POA: Diagnosis not present

## 2020-02-06 MED ORDER — OLANZAPINE 7.5 MG PO TABS: 7.5000 mg | ORAL_TABLET | Freq: Every day | ORAL | 0 refills | Status: DC

## 2020-02-06 NOTE — Telephone Encounter (Signed)
Could you contact [facility] and notify to discontinue benztropine? Thanks.

## 2020-02-06 NOTE — Addendum Note (Signed)
Addended by: Neysa Hotter on: 02/06/2020 12:20 PM   Modules accepted: Orders

## 2020-02-06 NOTE — Telephone Encounter (Signed)
Staff called pharmacy and spoke with Ed and informed him with what provider stated and he verbalized understanding.

## 2020-02-06 NOTE — Patient Instructions (Signed)
1.ContinueOlanzapine7.5mg  at night  2.Discontinue benztropine  3. Next appointment:10/18 at 1 PM

## 2020-02-09 LAB — BASIC METABOLIC PANEL
BUN/Creatinine Ratio: 14 (calc) (ref 6–22)
BUN: 16 mg/dL (ref 7–25)
CO2: 32 mmol/L (ref 20–32)
Calcium: 9.5 mg/dL (ref 8.6–10.4)
Chloride: 104 mmol/L (ref 98–110)
Creat: 1.16 mg/dL — ABNORMAL HIGH (ref 0.50–1.05)
Glucose, Bld: 98 mg/dL (ref 65–99)
Potassium: 4.2 mmol/L (ref 3.5–5.3)
Sodium: 143 mmol/L (ref 135–146)

## 2020-02-09 LAB — LIPID PANEL
Cholesterol: 217 mg/dL — ABNORMAL HIGH (ref <200)
HDL: 48 mg/dL — ABNORMAL LOW (ref >=50)
LDL Cholesterol (Calc): 146 mg/dL (calc) — ABNORMAL HIGH
Non-HDL Cholesterol (Calc): 169 mg/dL (calc) — ABNORMAL HIGH (ref <130)
Total CHOL/HDL Ratio: 4.5 (calc) (ref <5.0)
Triglycerides: 114 mg/dL (ref <150)

## 2020-02-13 ENCOUNTER — Encounter (HOSPITAL_COMMUNITY): Payer: Self-pay | Admitting: Psychiatry

## 2020-04-09 ENCOUNTER — Telehealth (HOSPITAL_COMMUNITY): Payer: Self-pay | Admitting: Psychiatry

## 2020-04-09 NOTE — Telephone Encounter (Signed)
Received a notification from pharmacy that the patient is seen by DR. Lay at [facility]. Could you ask the facility to verify this? If the patient is seen by another mental health care provider, I will defer all the care to this provider for consistency of care; the letter will be sent to them.

## 2020-04-09 NOTE — Telephone Encounter (Signed)
Received a fax from [facility].  The patient was reportedly seen by Dr. Geanie Cooley at [facility], and there was change in her medicatino on 9/23. Benztropine 2 mg qhs was reinitiated, and olanzapine 10 mg qhs was prescribed by Dr. Geanie Cooley.

## 2020-04-12 NOTE — Telephone Encounter (Signed)
[name] form patient place of residence called stating they talked with patient and she stated she will not be returning to see Dr. Vanetta Shawl anymore. Per [name] patient informed them that she wants to stay with Dr. Geanie Cooley.

## 2020-04-13 NOTE — Telephone Encounter (Signed)
noted 

## 2020-04-13 NOTE — Telephone Encounter (Signed)
Noted. Will plan to send discharge letter then.

## 2020-04-30 ENCOUNTER — Telehealth (HOSPITAL_COMMUNITY): Payer: Medicaid Other | Admitting: Psychiatry

## 2020-06-28 IMAGING — MG MM PLC BREAST LOC DEV 1ST LESION INC MAMMO GUIDE*L*
6 series · 6 of 6 positions shown · non-contrast
Comparison: Previous exams.

CLINICAL DATA: 54-year-old female with left breast complex
sclerosing lesion/sclerosing adenosis post stereotactic guided
biopsy of architectural distortion 09/15/2018. Patient presents for
wire localization prior to planned excision later today.

The coil shaped biopsy marking clip is positioned along the anterior
inferior margin of the biopsied distortion in the upper central left
breast.
EXAM:
NEEDLE LOCALIZATION OF THE LEFT BREAST WITH MAMMO GUIDANCE

[L ML (1 of 4)]
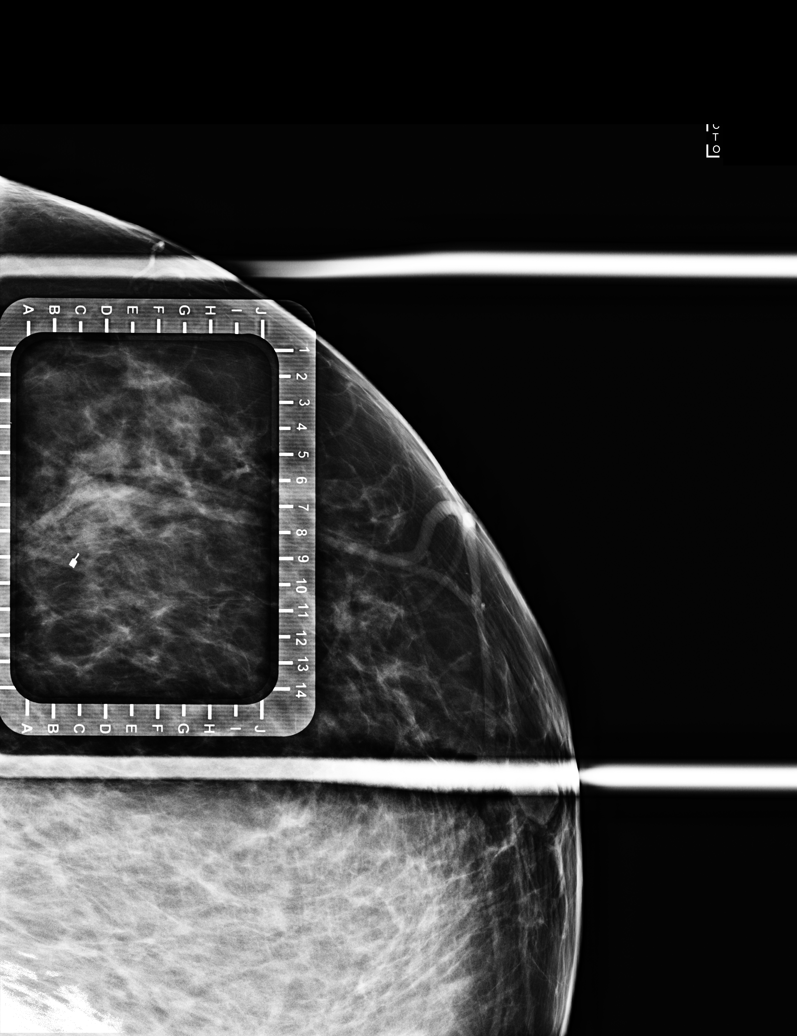

[L CC (1 of 2)]
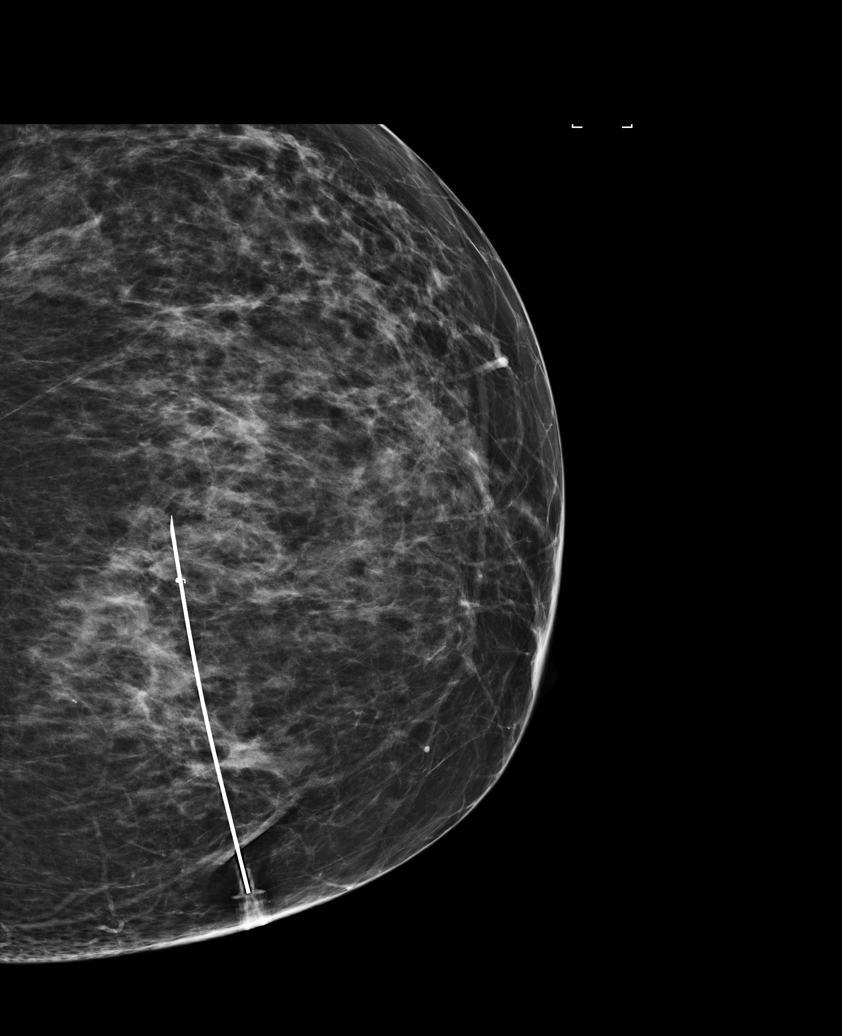

[L CC (2 of 2)]
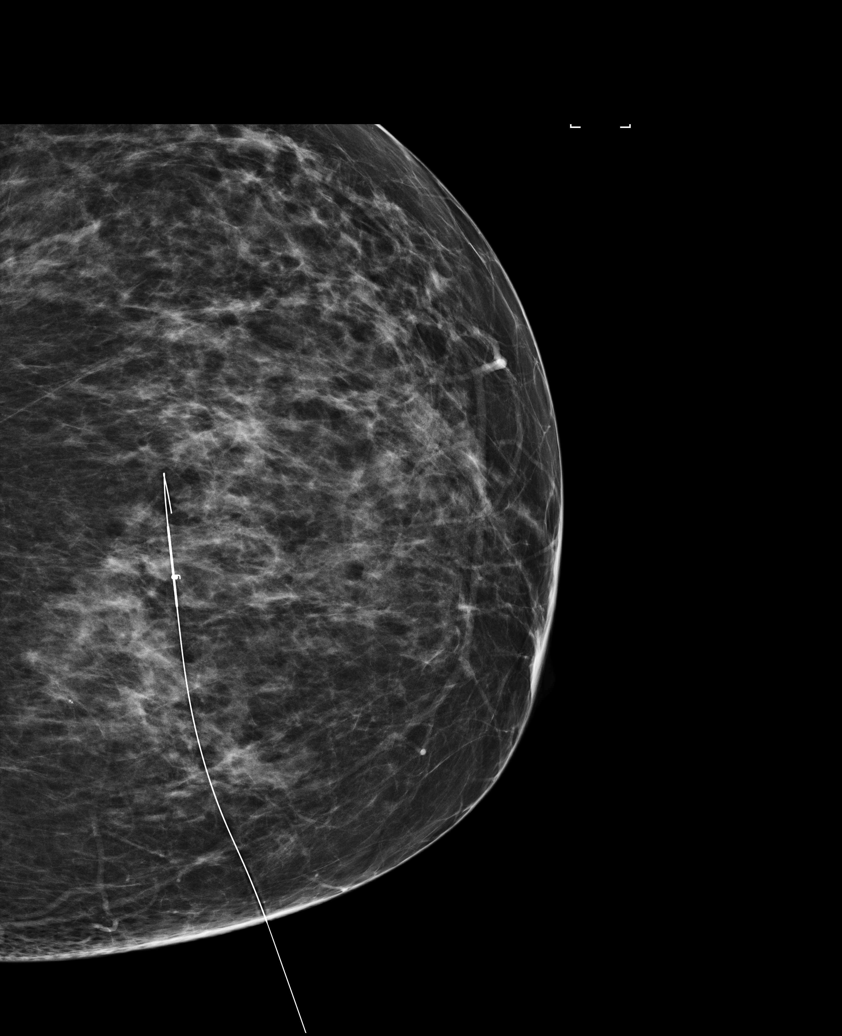

[L ML (2 of 4)]
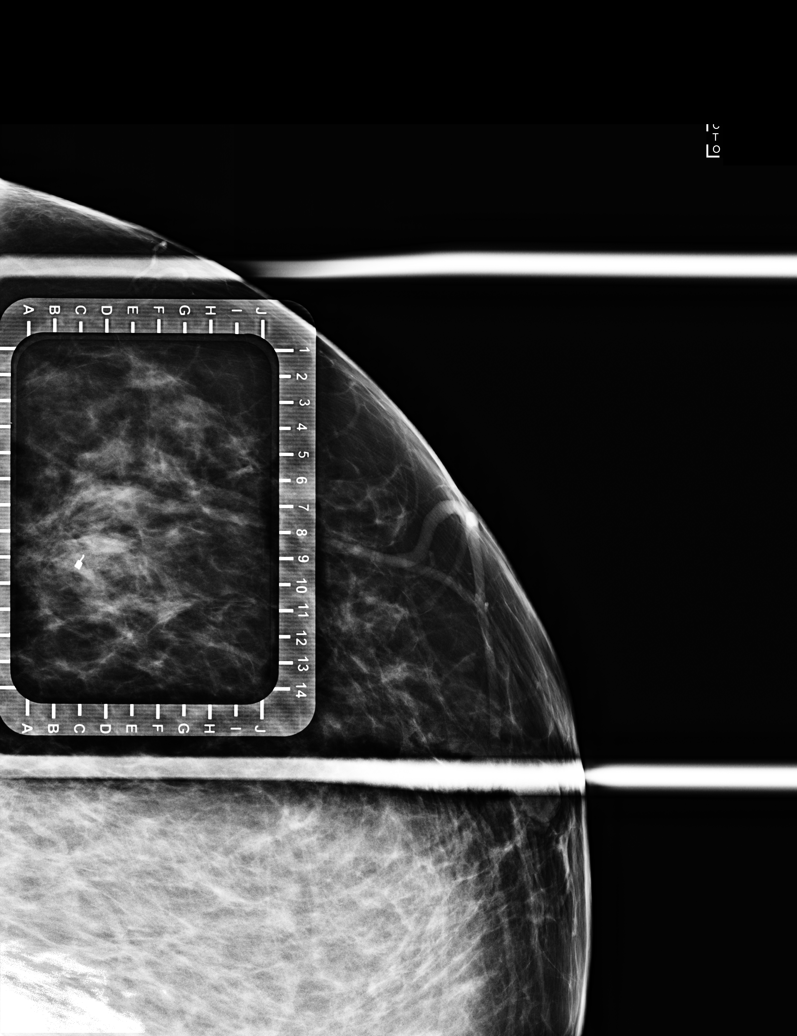

[L ML (3 of 4)]
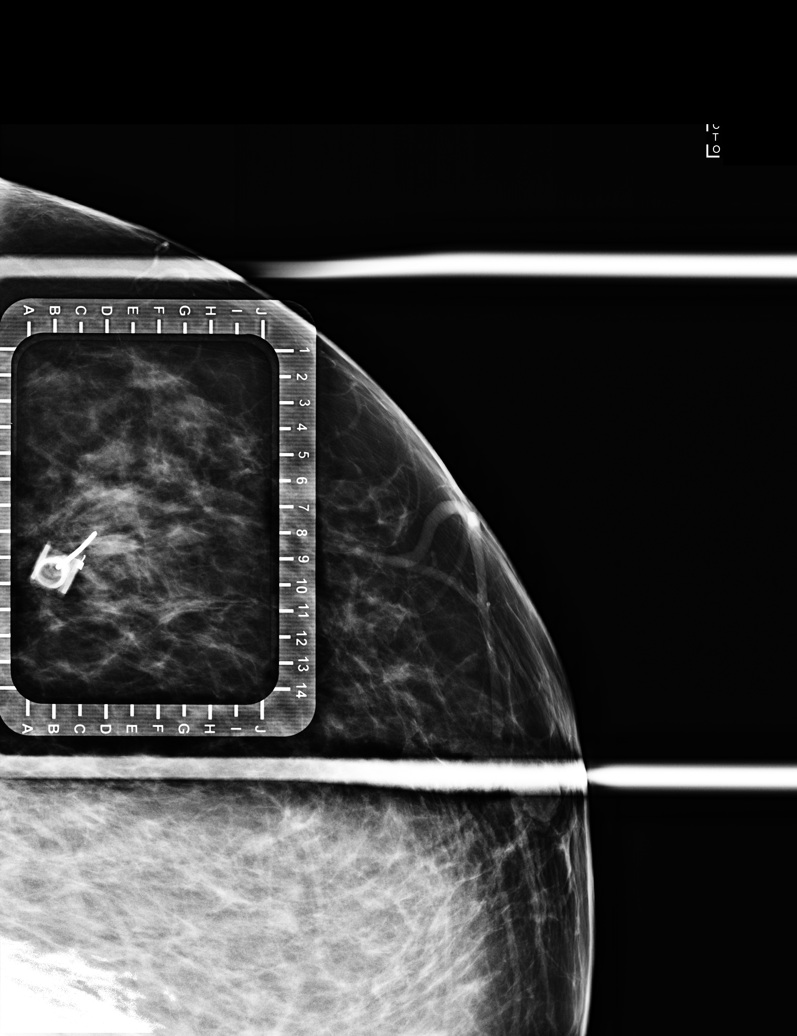

[L ML (4 of 4)]
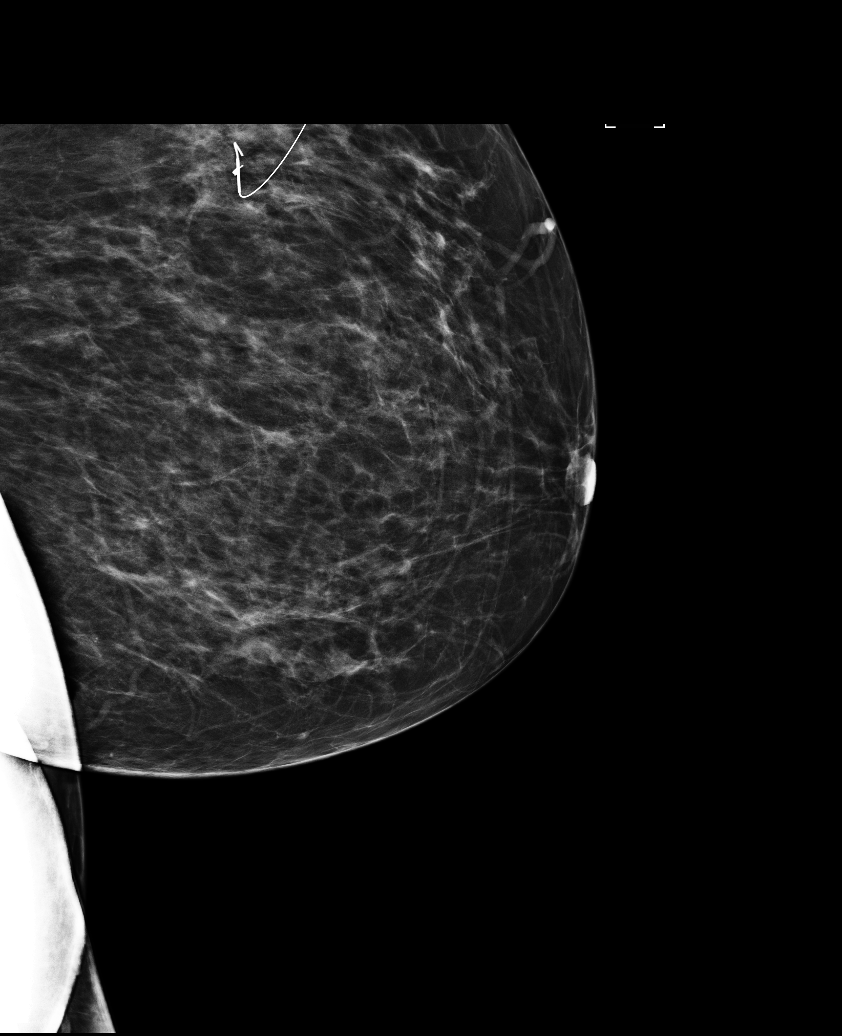

[6 of 6 positions shown; findings below may reference images not displayed]

PROCEDURE:
Patient presents for needle localization prior to left breast
excision. I met with the patient and we discussed the procedure of
needle localization including benefits and alternatives. We
discussed the high likelihood of a successful procedure. We
discussed the risks of the procedure, including infection, bleeding,
tissue injury, and further surgery. Informed, written consent was
given. The usual time-out protocol was performed immediately prior
to the procedure.

Using mammographic guidance, sterile technique, 1% lidocaine and a 9
cm modified Kopans needle, the coil shaped biopsy marking clip
positioned at the anterior inferior margin of the previously
biopsied distortion in the upper central left breast was localized
using medial to lateral approach. The images were marked for Dr.
Noelle.
IMPRESSION: Needle localization left breast. The coil shaped biopsy marking clip
and wire are positioned along the anterior inferior margin of the
distortion.

## 2020-09-25 ENCOUNTER — Other Ambulatory Visit (HOSPITAL_COMMUNITY): Payer: Self-pay | Admitting: Internal Medicine

## 2020-09-25 DIAGNOSIS — Z1231 Encounter for screening mammogram for malignant neoplasm of breast: Secondary | ICD-10-CM

## 2020-10-17 ENCOUNTER — Ambulatory Visit (HOSPITAL_COMMUNITY)
Admission: RE | Admit: 2020-10-17 | Discharge: 2020-10-17 | Disposition: A | Discharge status: Home / Self Care | Payer: Medicaid Other | Source: Ambulatory Visit | Attending: Internal Medicine | Admitting: Internal Medicine

## 2020-10-17 DIAGNOSIS — Z1231 Encounter for screening mammogram for malignant neoplasm of breast: Secondary | ICD-10-CM | POA: Insufficient documentation

## 2021-10-20 ENCOUNTER — Other Ambulatory Visit: Payer: Self-pay

## 2021-10-20 ENCOUNTER — Emergency Department (HOSPITAL_COMMUNITY)
Admission: EM | Admit: 2021-10-20 | Discharge: 2021-10-20 | Disposition: A | Discharge status: Home / Self Care | Payer: Medicaid Other | Attending: Emergency Medicine | Admitting: Emergency Medicine

## 2021-10-20 ENCOUNTER — Encounter (HOSPITAL_COMMUNITY): Payer: Self-pay | Admitting: Emergency Medicine

## 2021-10-20 DIAGNOSIS — W1830XA Fall on same level, unspecified, initial encounter: Secondary | ICD-10-CM | POA: Insufficient documentation

## 2021-10-20 DIAGNOSIS — R200 Anesthesia of skin: Secondary | ICD-10-CM | POA: Insufficient documentation

## 2021-10-20 DIAGNOSIS — Z7984 Long term (current) use of oral hypoglycemic drugs: Secondary | ICD-10-CM | POA: Diagnosis not present

## 2021-10-20 DIAGNOSIS — W19XXXA Unspecified fall, initial encounter: Secondary | ICD-10-CM

## 2021-10-20 DIAGNOSIS — R531 Weakness: Secondary | ICD-10-CM | POA: Insufficient documentation

## 2021-10-20 DIAGNOSIS — G629 Polyneuropathy, unspecified: Secondary | ICD-10-CM | POA: Diagnosis not present

## 2021-10-20 DIAGNOSIS — Z79899 Other long term (current) drug therapy: Secondary | ICD-10-CM | POA: Insufficient documentation

## 2021-10-20 NOTE — ED Provider Notes (Signed)
?[facility] DEPARTMENT ?Provider Note ? ? ?CSN: [id number] ?Arrival date & time: 10/20/21  9767 ? ?  ? ?History ? ?Chief Complaint  ?Patient presents with  ? Weakness  ? ? ?Terri Bauer is a 56 y.o. female. ? ?HPI ?Patient here for evaluation of "weakness," which was suspected to have caused her to slip off the commode this morning.  She. ?On the floor.  She was transferred by EMS.  She did not have any obvious injuries.  There is a report that she is complaining of numbness in both feet. ? ?Patient has 2 family members in the room who states that she has had some numbness of her fingers for a month.  They report that the patient is at her baseline, relative to mental status. ?  ? ?Home Medications ?Prior to Admission medications   ?Medication Sig Start Date End Date Taking? Authorizing Provider  ?amLODipine (NORVASC) 10 MG tablet Take 10 mg by mouth daily. (0800)    [provider]  ?benztropine (COGENTIN) 1 MG tablet Take 1 tablet (1 mg total) by mouth daily at 8 pm. 12/06/19   Neysa Hotter, MD  ?Elwin Sleight 200-5 MCG/ACT AERO Inhale 2 puffs into the lungs in the morning and at bedtime. (0800 & 2000) 08/24/19   [provider]  ?ferrous sulfate 325 (65 FE) MG tablet Take 325 mg by mouth in the morning and at bedtime. (0800 & 2000) 09/19/19   [provider]  ?fluticasone (FLONASE) 50 MCG/ACT nasal spray Place 1 spray into both nostrils daily. (0800) 06/23/19   [provider]  ?ibuprofen (ADVIL) 600 MG tablet Take 600 mg by mouth every 6 (six) hours as needed (for pain.).    [provider]  ?ketoconazole (NIZORAL) 2 % cream Apply 1 application topically 2 (two) times daily. (0800 & 2000) 08/30/19   [provider]  ?losartan-hydrochlorothiazide (HYZAAR) 50-12.5 MG tablet Take 1 tablet by mouth daily. (0800)    [provider]  ?metFORMIN (GLUCOPHAGE) 500 MG tablet Take 500 mg by mouth 2 (two) times daily with a meal. (0800 & 1700)    [provider]  ?OLANZapine (ZYPREXA) 7.5 MG tablet Take 1 tablet (7.5 mg total) by mouth at bedtime. 03/06/20 06/04/20  Neysa Hotter, MD  ?omeprazole (PRILOSEC) 20 MG capsule TAKE ONE CAPSULE BY MOUTH TWICE DAILY FOR 3 MONTHS THEN 1 CAPSULE EVERY DAY ?Patient taking differently: Take 20 mg by mouth daily. (0800) 05/16/16   Anice Paganini, NP  ?potassium chloride SA (KLOR-CON) 20 MEQ tablet Take 1 tablet (20 mEq total) by mouth daily. ?Patient taking differently: Take 20 mEq by mouth daily. (0800) 07/20/19   Arthor Captain, PA-C  ?   ? ?Allergies    ?Patient has no known allergies.   ? ?Review of Systems   ?Review of Systems ? ?Physical Exam ?Updated Vital Signs ?BP 121/71   Pulse 92   Temp 98.1 ?F (36.7 ?C) (Oral)   Resp 17   Ht 5\' 1"  (1.549 m)   Wt 115.6 kg   SpO2 93%   BMI 48.15 kg/m?  ?Physical Exam ?Vitals and nursing note reviewed.  ?Constitutional:   ?   General: She is not in acute distress. ?   Appearance: She is well-developed. She is obese. She is not ill-appearing or diaphoretic.  ?HENT:  ?   Head: Normocephalic and atraumatic.  ?   Right Ear: External ear normal.  ?   Left Ear: External ear normal.  ?Eyes:  ?  Conjunctiva/sclera: Conjunctivae normal.  ?   Pupils: Pupils are equal, round, and reactive to light.  ?Neck:  ?   Trachea: Phonation normal.  ?Cardiovascular:  ?   Rate and Rhythm: Normal rate.  ?Pulmonary:  ?   Effort: Pulmonary effort is normal.  ?Chest:  ?   Chest wall: No tenderness.  ?Abdominal:  ?   General: There is no distension.  ?   Palpations: Abdomen is soft.  ?Musculoskeletal:     ?   General: No swelling or tenderness. Normal range of motion.  ?   Cervical back: Normal range of motion and neck supple.  ?Skin: ?   General: Skin is warm and dry.  ?   Coloration: Skin is not jaundiced or pale.  ?Neurological:  ?   Mental Status: She is alert.  ?   Cranial Nerves: No cranial nerve deficit.  ?   Sensory: No sensory deficit.  ?   Motor: No abnormal muscle tone.  ?   Coordination:  Coordination normal.  ?Psychiatric:     ?   Mood and Affect: Mood normal.     ?   Behavior: Behavior normal.     ?   Thought Content: Thought content normal.     ?   Judgment: Judgment normal.  ? ? ?ED Results / Procedures / Treatments   ?Labs ?(all labs ordered are listed, but only abnormal results are displayed) ?Labs Reviewed - No data to display ? ?EKG ?None ? ?Radiology ?No results found. ? ?Procedures ?Procedures  ? ? ?Medications Ordered in ED ?Medications - No data to display ? ?ED Course/ Medical Decision Making/ A&P ?  ?                        ?Medical Decision Making ?Patient presenting for evaluation of fall from commode.  She has reported weakness however per family member she is at her baseline functioning and mental status.  Family members in the room as I examined the patient. ? ?Problems Addressed: ?Neuropathy: ?   Details: Reported by family members for 1 month.  They state that the discomfort is in her fingers.  They do not know she has talked to her PCP about it. ? ?Amount and/or Complexity of Data Reviewed ?Independent Historian: caregiver ?   Details: Patient has 2 aunts were in the room, who give me history and were present during the exam. ? ?Risk ?Decision regarding hospitalization. ?Risk Details: Patient presenting after fall from commode.  She has no signs of injury.  She is at her baseline.  She does not require imaging, lab work or hospitalization at this time.  She has a chronic complaint of numbness of her fingers and possibly her feet.  This can be safely evaluated by her PCP as an outpatient. ? ? ? ? ? ? ? ? ? ? ?Final Clinical Impression(s) / ED Diagnoses ?Final diagnoses:  ?None  ? ? ?Rx / DC Orders ?ED Discharge Orders   ? ? None  ? ?  ? ? ?  ?Mancel Bale, MD ?10/20/21 0920 ? ?

## 2021-10-20 NOTE — ED Triage Notes (Signed)
Pt to the ED from [facility] with EMS after sliding off the toilet this morning.Pt did not hit her head, loose consciousness, and does not take blood thinners ? 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 NIH is negative. ? ?

## 2021-10-20 NOTE — Discharge Instructions (Signed)
There were no signs of injuries after she fell from the commode today.  Please see her doctor for further care and treatment and evaluation of a possible diagnosis of neuropathy.  If she has pain, after the fall, she can use Tylenol every 4 hours.  Return here, if needed. ?

## 2021-10-20 NOTE — ED Notes (Signed)
Group Home contacted for pick-up. ?

## 2021-10-24 ENCOUNTER — Encounter (HOSPITAL_COMMUNITY): Payer: Self-pay

## 2021-10-24 ENCOUNTER — Emergency Department (HOSPITAL_COMMUNITY)
Admission: EM | Admit: 2021-10-24 | Discharge: 2021-10-25 | Disposition: A | Discharge status: Home / Self Care | Payer: Medicaid Other | Attending: Emergency Medicine | Admitting: Emergency Medicine

## 2021-10-24 ENCOUNTER — Other Ambulatory Visit: Payer: Self-pay

## 2021-10-24 DIAGNOSIS — Z7984 Long term (current) use of oral hypoglycemic drugs: Secondary | ICD-10-CM | POA: Insufficient documentation

## 2021-10-24 DIAGNOSIS — I10 Essential (primary) hypertension: Secondary | ICD-10-CM | POA: Diagnosis not present

## 2021-10-24 DIAGNOSIS — F419 Anxiety disorder, unspecified: Secondary | ICD-10-CM | POA: Diagnosis present

## 2021-10-24 DIAGNOSIS — Z79899 Other long term (current) drug therapy: Secondary | ICD-10-CM | POA: Insufficient documentation

## 2021-10-24 DIAGNOSIS — E119 Type 2 diabetes mellitus without complications: Secondary | ICD-10-CM | POA: Diagnosis not present

## 2021-10-24 NOTE — Discharge Instructions (Addendum)
Please utilize the air conditioning while at home to help from becoming too hot.  ? ?It was a pleasure caring for you today in the emergency department. ? ?Please return to the emergency department for any worsening or worrisome symptoms. ? ?

## 2021-10-24 NOTE — ED Provider Notes (Signed)
?[facility] EMERGENCY DEPARTMENT ?Provider Note ? ? ?CSN: [id number] ?Arrival date & time: 10/24/21  2106 ? ?  ? ?History ? ?Chief Complaint  ?Patient presents with  ? Anxiety  ? ? ?Terri Bauer is a 56 y.o. female. ? ? Patient as above with significant medical history as below, including intellectual disability, hypertension, diabetes who presents to the ED with complaint of feeling hot, anxiety ? ?Patient reports that she was very hot today in a group home and she was feeling upset.  Group home staff would not turn on the air conditioning like she requested and she became upset.  Felt like she was having a panic attack.  Requested to come to the hospital ? ?When patient was placed in the back of the ambulance she felt significantly better as she was in a cool environment in the air conditioning.  Feels her symptoms resolved entirely. ? ?Upon arrival to the emergency part patient is asymptomatic.  Feels back to her baseline. ? ? ?Past Medical History: ?No date: Bronchitis ?No date: Diabetes [facility]) ?No date: Hypertension ?No date: Intellectual delay ?No date: Other intellectual disabilities ?    Comment:  sees psychiatrist ?No date: Poor historian ?    Comment:  limited due to intellectual disabilities. ? ?Past Surgical History: ?11/15/2019: BREAST BIOPSY; Left ?    Comment:  Procedure: BREAST BIOPSY WITH NEEDLE LOCALIZATION;   ?             Surgeon: Franky Macho, MD;  Location: [facility] ORS;  Service:  ?             General;  Laterality: Left; ?10/09/2015: COLONOSCOPY WITH PROPOFOL; N/A ?    Comment:  Procedure: COLONOSCOPY WITH PROPOFOL;  Surgeon: Duncan Dull L  ?             Fields, MD;  Location: [facility] ENDO SUITE;  Service:  ?             Endoscopy;  Laterality: N/A;  1100-moved to 845 per  ?             Ginger ?No date: none  ? ? ?The history is provided by the patient. No language interpreter was used.  ?Anxiety ?Pertinent negatives include no chest pain, no abdominal pain, no headaches and no shortness of breath.  ? ?   ? ?Home Medications ?Prior to Admission medications   ?Medication Sig Start Date End Date Taking? Authorizing Provider  ?amLODipine (NORVASC) 10 MG tablet Take 10 mg by mouth daily. (0800)    [provider]  ?benztropine (COGENTIN) 1 MG tablet Take 1 tablet (1 mg total) by mouth daily at 8 pm. 12/06/19   Neysa Hotter, MD  ?Elwin Sleight 200-5 MCG/ACT AERO Inhale 2 puffs into the lungs in the morning and at bedtime. (0800 & 2000) 08/24/19   [provider]  ?ferrous sulfate 325 (65 FE) MG tablet Take 325 mg by mouth in the morning and at bedtime. (0800 & 2000) 09/19/19   [provider]  ?fluticasone (FLONASE) 50 MCG/ACT nasal spray Place 1 spray into both nostrils daily. (0800) 06/23/19   [provider]  ?ibuprofen (ADVIL) 600 MG tablet Take 600 mg by mouth every 6 (six) hours as needed (for pain.).    [provider]  ?ketoconazole (NIZORAL) 2 % cream Apply 1 application topically 2 (two) times daily. (0800 & 2000) 08/30/19   [provider]  ?losartan-hydrochlorothiazide (HYZAAR) 50-12.5 MG tablet Take 1 tablet by mouth daily. (0800)  [provider]  ?metFORMIN (GLUCOPHAGE) 500 MG tablet Take 500 mg by mouth 2 (two) times daily with a meal. (0800 & 1700)    [provider]  ?OLANZapine (ZYPREXA) 7.5 MG tablet Take 1 tablet (7.5 mg total) by mouth at bedtime. 03/06/20 06/04/20  Neysa HotterHisada, Reina, MD  ?omeprazole (PRILOSEC) 20 MG capsule TAKE ONE CAPSULE BY MOUTH TWICE DAILY FOR 3 MONTHS THEN 1 CAPSULE EVERY DAY ?Patient taking differently: Take 20 mg by mouth daily. (0800) 05/16/16   Anice PaganiniGill, Eric A, NP  ?potassium chloride SA (KLOR-CON) 20 MEQ tablet Take 1 tablet (20 mEq total) by mouth daily. ?Patient taking differently: Take 20 mEq by mouth daily. (0800) 07/20/19   Arthor CaptainHarris, Abigail, PA-C  ?   ? ?Allergies    ?Patient has no known allergies.   ? ?Review of Systems   ?Review of Systems  ?Constitutional:  Negative for chills and fever.  ?HENT:  Negative for  facial swelling and trouble swallowing.   ?Eyes:  Negative for photophobia and visual disturbance.  ?Respiratory:  Negative for cough and shortness of breath.   ?Cardiovascular:  Negative for chest pain and palpitations.  ?Gastrointestinal:  Negative for abdominal pain, nausea and vomiting.  ?Endocrine: Negative for polydipsia and polyuria.  ?Genitourinary:  Negative for difficulty urinating and hematuria.  ?Musculoskeletal:  Negative for gait problem and joint swelling.  ?Skin:  Negative for pallor and rash.  ?Neurological:  Negative for syncope and headaches.  ?Psychiatric/Behavioral:  Negative for agitation and confusion. The patient is nervous/anxious.   ? ?Physical Exam ?Updated Vital Signs ?BP 123/66   Pulse 88   Temp 98.1 ?F (36.7 ?C) (Oral)   Resp 16   Ht 5\' 1"  (1.549 m)   Wt 115.6 kg   SpO2 92%   BMI 48.15 kg/m?  ?Physical Exam ?Vitals and nursing note reviewed.  ?Constitutional:   ?   General: She is not in acute distress. ?   Appearance: Normal appearance. She is obese.  ?HENT:  ?   Head: Normocephalic and atraumatic.  ?   Right Ear: External ear normal.  ?   Left Ear: External ear normal.  ?   Nose: Nose normal.  ?   Mouth/Throat:  ?   Mouth: Mucous membranes are moist.  ?Eyes:  ?   General: No scleral icterus.    ?   Right eye: No discharge.     ?   Left eye: No discharge.  ?   Extraocular Movements: Extraocular movements intact.  ?   Pupils: Pupils are equal, round, and reactive to light.  ?Cardiovascular:  ?   Rate and Rhythm: Normal rate and regular rhythm.  ?   Pulses: Normal pulses.  ?   Heart sounds: Normal heart sounds.  ?Pulmonary:  ?   Effort: Pulmonary effort is normal. No respiratory distress.  ?   Breath sounds: Normal breath sounds.  ?Abdominal:  ?   General: Abdomen is flat.  ?   Palpations: Abdomen is soft.  ?   Tenderness: There is no abdominal tenderness.  ?Musculoskeletal:     ?   General: Normal range of motion.  ?   Cervical back: Normal range of motion.  ?   Right lower leg:  No edema.  ?   Left lower leg: No edema.  ?Skin: ?   General: Skin is warm and dry.  ?   Capillary Refill: Capillary refill takes less than 2 seconds.  ?Neurological:  ?   Mental Status: She is alert  and oriented to person, place, and time. Mental status is at baseline.  ?   GCS: GCS eye subscore is 4. GCS verbal subscore is 5. GCS motor subscore is 6.  ?   Cranial Nerves: Cranial nerves 2-12 are intact. No dysarthria or facial asymmetry.  ?   Sensory: Sensation is intact.  ?   Motor: Motor function is intact. No tremor.  ?   Coordination: Coordination is intact.  ?   Gait: Gait is intact.  ?Psychiatric:     ?   Mood and Affect: Mood normal.     ?   Behavior: Behavior normal. Behavior is cooperative.  ? ? ?ED Results / Procedures / Treatments   ?Labs ?(all labs ordered are listed, but only abnormal results are displayed) ?Labs Reviewed  ?URINALYSIS, ROUTINE W REFLEX MICROSCOPIC  ? ? ?EKG ?None ? ?Radiology ?No results found. ? ?Procedures ?Procedures  ? ? ?Medications Ordered in ED ?Medications - No data to display ? ?ED Course/ Medical Decision Making/ A&P ?  ?                        ?Medical Decision Making ?Amount and/or Complexity of Data Reviewed ?Labs: ordered. ? ? ? ?CC: "Feeling hot" anxious ? ?This patient presents to the Emergency Department for the above complaint. This involves an extensive number of treatment options and is a complaint that carries with it a high risk of complications and morbidity. Vital signs were reviewed. Serious etiologies considered. ? ?Record review:  ?Previous records obtained and reviewed  ? ?Additional history obtained from aunt ? ?Medical and surgical history as noted above.  ? ?Work up as above, notable for:  ? ?Cardiac monitoring reviewed and interpreted personally which shows NSR ? ?Social determinants of health include - MR ? ?Reassessment:  ?Patient has no acute complaints at time my assessment.  She is tolerant p.o. intake without difficulty.  She has no chest pain,  dyspnea, abdominal pain, nausea or vomiting.  She does not appear acutely psychotic.  She is not suicidal or homicidal.  There are no hallucinations.  She feels she is back to her baseline.  MD with a steady gait.  D

## 2021-10-24 NOTE — ED Triage Notes (Addendum)
Pt presents to ED from [facility] by [facility] for c/o anxiety and being "hot". EMS reports temperature in group home is around 80 degrees and they refused to turn [facility] on, pt says she got hot, started having trouble catching her breath and having panic attack.EMS report symptoms have resolved. Pt calm and breathing normally at this time. Pt says she feels better after being in EMS with [facility] running. ?

## 2021-10-28 ENCOUNTER — Emergency Department (HOSPITAL_COMMUNITY): Payer: Medicaid Other

## 2021-10-28 ENCOUNTER — Other Ambulatory Visit: Payer: Self-pay

## 2021-10-28 ENCOUNTER — Inpatient Hospital Stay (HOSPITAL_COMMUNITY)
Admission: EM | Admit: 2021-10-28 | Discharge: 2021-10-30 | DRG: 641 | Disposition: A | Discharge status: Home Health | Payer: Medicaid Other | Attending: Family Medicine | Admitting: Family Medicine

## 2021-10-28 ENCOUNTER — Encounter (HOSPITAL_COMMUNITY): Payer: Self-pay | Admitting: *Deleted

## 2021-10-28 DIAGNOSIS — I10 Essential (primary) hypertension: Secondary | ICD-10-CM | POA: Diagnosis not present

## 2021-10-28 DIAGNOSIS — E119 Type 2 diabetes mellitus without complications: Secondary | ICD-10-CM

## 2021-10-28 DIAGNOSIS — Z6841 Body Mass Index (BMI) 40.0 and over, adult: Secondary | ICD-10-CM | POA: Diagnosis not present

## 2021-10-28 DIAGNOSIS — R296 Repeated falls: Secondary | ICD-10-CM | POA: Diagnosis present

## 2021-10-28 DIAGNOSIS — R159 Full incontinence of feces: Secondary | ICD-10-CM | POA: Diagnosis present

## 2021-10-28 DIAGNOSIS — N179 Acute kidney failure, unspecified: Secondary | ICD-10-CM | POA: Diagnosis present

## 2021-10-28 DIAGNOSIS — I119 Hypertensive heart disease without heart failure: Secondary | ICD-10-CM | POA: Diagnosis present

## 2021-10-28 DIAGNOSIS — E876 Hypokalemia: Secondary | ICD-10-CM | POA: Diagnosis not present

## 2021-10-28 DIAGNOSIS — Z818 Family history of other mental and behavioral disorders: Secondary | ICD-10-CM

## 2021-10-28 DIAGNOSIS — T502X5A Adverse effect of carbonic-anhydrase inhibitors, benzothiadiazides and other diuretics, initial encounter: Secondary | ICD-10-CM | POA: Diagnosis present

## 2021-10-28 DIAGNOSIS — Z7951 Long term (current) use of inhaled steroids: Secondary | ICD-10-CM | POA: Diagnosis not present

## 2021-10-28 DIAGNOSIS — F79 Unspecified intellectual disabilities: Secondary | ICD-10-CM

## 2021-10-28 DIAGNOSIS — W19XXXA Unspecified fall, initial encounter: Secondary | ICD-10-CM

## 2021-10-28 DIAGNOSIS — Z800 Family history of malignant neoplasm of digestive organs: Secondary | ICD-10-CM

## 2021-10-28 DIAGNOSIS — Z7984 Long term (current) use of oral hypoglycemic drugs: Secondary | ICD-10-CM

## 2021-10-28 DIAGNOSIS — E860 Dehydration: Secondary | ICD-10-CM | POA: Diagnosis present

## 2021-10-28 DIAGNOSIS — Y92009 Unspecified place in unspecified non-institutional (private) residence as the place of occurrence of the external cause: Secondary | ICD-10-CM

## 2021-10-28 DIAGNOSIS — Z79899 Other long term (current) drug therapy: Secondary | ICD-10-CM | POA: Diagnosis not present

## 2021-10-28 DIAGNOSIS — D649 Anemia, unspecified: Secondary | ICD-10-CM | POA: Diagnosis present

## 2021-10-28 DIAGNOSIS — Z833 Family history of diabetes mellitus: Secondary | ICD-10-CM

## 2021-10-28 LAB — CBC WITH DIFFERENTIAL/PLATELET
Abs Immature Granulocytes: 0.05 10*3/uL (ref 0.00–0.07)
Basophils Absolute: 0 10*3/uL (ref 0.0–0.1)
Basophils Relative: 0 %
Eosinophils Absolute: 0.2 10*3/uL (ref 0.0–0.5)
Eosinophils Relative: 2 %
HCT: 36.3 % (ref 36.0–46.0)
Hemoglobin: 11.5 g/dL — ABNORMAL LOW (ref 12.0–15.0)
Immature Granulocytes: 1 %
Lymphocytes Relative: 15 %
Lymphs Abs: 1.1 10*3/uL (ref 0.7–4.0)
MCH: 32.1 pg (ref 26.0–34.0)
MCHC: 31.7 g/dL (ref 30.0–36.0)
MCV: 101.4 fL — ABNORMAL HIGH (ref 80.0–100.0)
Monocytes Absolute: 0.3 10*3/uL (ref 0.1–1.0)
Monocytes Relative: 4 %
Neutro Abs: 5.5 10*3/uL (ref 1.7–7.7)
Neutrophils Relative %: 78 %
Platelets: 289 10*3/uL (ref 150–400)
RBC: 3.58 MIL/uL — ABNORMAL LOW (ref 3.87–5.11)
RDW: 13.7 % (ref 11.5–15.5)
WBC: 7.1 10*3/uL (ref 4.0–10.5)
nRBC: 0 % (ref 0.0–0.2)

## 2021-10-28 LAB — COMPREHENSIVE METABOLIC PANEL
ALT: 29 U/L (ref 0–44)
AST: 117 U/L — ABNORMAL HIGH (ref 15–41)
Albumin: 3.7 g/dL (ref 3.5–5.0)
Alkaline Phosphatase: 90 U/L (ref 38–126)
Anion gap: 16 — ABNORMAL HIGH (ref 5–15)
BUN: 10 mg/dL (ref 6–20)
CO2: 31 mmol/L (ref 22–32)
Calcium: 5.5 mg/dL — CL (ref 8.9–10.3)
Chloride: 96 mmol/L — ABNORMAL LOW (ref 98–111)
Creatinine, Ser: 1.72 mg/dL — ABNORMAL HIGH (ref 0.44–1.00)
GFR, Estimated: 34 mL/min — ABNORMAL LOW (ref >60)
Glucose, Bld: 112 mg/dL — ABNORMAL HIGH (ref 70–99)
Potassium: 2.2 mmol/L — CL (ref 3.5–5.1)
Sodium: 143 mmol/L (ref 135–145)
Total Bilirubin: 0.8 mg/dL (ref 0.3–1.2)
Total Protein: 8.2 g/dL — ABNORMAL HIGH (ref 6.5–8.1)

## 2021-10-28 LAB — CBG MONITORING, ED: Glucose-Capillary: 87 mg/dL (ref 70–99)

## 2021-10-28 LAB — MAGNESIUM: Magnesium: 0.7 mg/dL — CL (ref 1.7–2.4)

## 2021-10-28 LAB — HEMOGLOBIN A1C
Hgb A1c MFr Bld: 5.8 % — ABNORMAL HIGH (ref 4.8–5.6)
Mean Plasma Glucose: 119.76 mg/dL

## 2021-10-28 LAB — TSH: TSH: 1.946 u[IU]/mL (ref 0.350–4.500)

## 2021-10-28 LAB — TYPE AND SCREEN
ABO/RH(D): A POS
Antibody Screen: NEGATIVE

## 2021-10-28 LAB — POC OCCULT BLOOD, ED: Fecal Occult Bld: NEGATIVE

## 2021-10-28 MED ORDER — ENOXAPARIN SODIUM 60 MG/0.6ML IJ SOSY
50.0000 mg | PREFILLED_SYRINGE | INTRAMUSCULAR | Status: DC
  Administered 2021-10-28 – 2021-10-29 (×2): 50 mg via SUBCUTANEOUS
  Filled 2021-10-28 (×2): qty 0.6

## 2021-10-28 MED ORDER — ACETAMINOPHEN 325 MG PO TABS: 650.0000 mg | ORAL_TABLET | Freq: Four times a day (QID) | ORAL | Status: DC | PRN

## 2021-10-28 MED ORDER — BISACODYL 5 MG PO TBEC: 5.0000 mg | DELAYED_RELEASE_TABLET | Freq: Every day | ORAL | Status: DC | PRN

## 2021-10-28 MED ORDER — MAGNESIUM SULFATE 4 GM/100ML IV SOLN
4.0000 g | Freq: Once | INTRAVENOUS | Status: AC
  Administered 2021-10-28: 4 g via INTRAVENOUS
  Filled 2021-10-28: qty 100

## 2021-10-28 MED ORDER — MOMETASONE FURO-FORMOTEROL FUM 200-5 MCG/ACT IN AERO
2.0000 | INHALATION_SPRAY | Freq: Two times a day (BID) | RESPIRATORY_TRACT | Status: DC
  Administered 2021-10-28 – 2021-10-30 (×4): 2 via RESPIRATORY_TRACT
  Filled 2021-10-28: qty 8.8

## 2021-10-28 MED ORDER — HYDRALAZINE HCL 20 MG/ML IJ SOLN: 5.0000 mg | INTRAMUSCULAR | Status: DC | PRN

## 2021-10-28 MED ORDER — ONDANSETRON HCL 4 MG/2ML IJ SOLN
4.0000 mg | Freq: Four times a day (QID) | INTRAMUSCULAR | Status: DC | PRN
Start: 2021-10-28 — End: 2021-10-30

## 2021-10-28 MED ORDER — FERROUS SULFATE 325 (65 FE) MG PO TABS
325.0000 mg | ORAL_TABLET | Freq: Every day | ORAL | Status: DC
  Administered 2021-10-29 – 2021-10-30 (×2): 325 mg via ORAL
  Filled 2021-10-28 (×2): qty 1

## 2021-10-28 MED ORDER — POTASSIUM CHLORIDE 10 MEQ/100ML IV SOLN
10.0000 meq | INTRAVENOUS | Status: AC
  Administered 2021-10-28 (×3): 10 meq via INTRAVENOUS
  Filled 2021-10-28 (×3): qty 100

## 2021-10-28 MED ORDER — ONDANSETRON HCL 4 MG PO TABS: 4.0000 mg | ORAL_TABLET | Freq: Four times a day (QID) | ORAL | Status: DC | PRN

## 2021-10-28 MED ORDER — ATORVASTATIN CALCIUM 40 MG PO TABS
40.0000 mg | ORAL_TABLET | Freq: Every evening | ORAL | Status: DC
  Administered 2021-10-28 – 2021-10-29 (×2): 40 mg via ORAL
  Filled 2021-10-28 (×2): qty 1

## 2021-10-28 MED ORDER — FLUTICASONE PROPIONATE 50 MCG/ACT NA SUSP
1.0000 | Freq: Every day | NASAL | Status: DC
  Administered 2021-10-29 – 2021-10-30 (×2): 1 via NASAL
  Filled 2021-10-28: qty 16

## 2021-10-28 MED ORDER — ACETAMINOPHEN 650 MG RE SUPP: 650.0000 mg | Freq: Four times a day (QID) | RECTAL | Status: DC | PRN

## 2021-10-28 MED ORDER — POTASSIUM CHLORIDE CRYS ER 20 MEQ PO TBCR
40.0000 meq | EXTENDED_RELEASE_TABLET | Freq: Once | ORAL | Status: AC
  Administered 2021-10-28: 40 meq via ORAL
  Filled 2021-10-28: qty 2

## 2021-10-28 MED ORDER — ALBUTEROL SULFATE (2.5 MG/3ML) 0.083% IN NEBU: 2.5000 mg | INHALATION_SOLUTION | RESPIRATORY_TRACT | Status: DC | PRN

## 2021-10-28 MED ORDER — POTASSIUM CHLORIDE IN NACL 20-0.9 MEQ/L-% IV SOLN
INTRAVENOUS | Status: DC
  Filled 2021-10-28 (×2): qty 1000

## 2021-10-28 MED ORDER — AMLODIPINE BESYLATE 5 MG PO TABS
5.0000 mg | ORAL_TABLET | Freq: Every day | ORAL | Status: DC
  Administered 2021-10-29 – 2021-10-30 (×2): 5 mg via ORAL
  Filled 2021-10-28 (×2): qty 1

## 2021-10-28 MED ORDER — PANTOPRAZOLE SODIUM 40 MG PO TBEC
40.0000 mg | DELAYED_RELEASE_TABLET | Freq: Every day | ORAL | Status: DC
Start: 2021-10-29 — End: 2021-10-30
  Administered 2021-10-29 – 2021-10-30 (×2): 40 mg via ORAL
  Filled 2021-10-28 (×2): qty 1

## 2021-10-28 MED ORDER — INSULIN ASPART 100 UNIT/ML IJ SOLN
2.0000 [IU] | Freq: Three times a day (TID) | INTRAMUSCULAR | Status: DC
  Administered 2021-10-29 – 2021-10-30 (×5): 2 [IU] via SUBCUTANEOUS

## 2021-10-28 MED ORDER — CALCIUM CARBONATE ANTACID 500 MG PO CHEW
1.0000 | CHEWABLE_TABLET | Freq: Three times a day (TID) | ORAL | Status: DC
  Administered 2021-10-28 – 2021-10-30 (×4): 200 mg via ORAL
  Filled 2021-10-28 (×4): qty 1

## 2021-10-28 MED ORDER — KETOCONAZOLE 2 % EX CREA
1.0000 "application " | TOPICAL_CREAM | Freq: Two times a day (BID) | CUTANEOUS | Status: DC
  Administered 2021-10-29 – 2021-10-30 (×2): 1 via TOPICAL
  Filled 2021-10-28: qty 15

## 2021-10-28 MED ORDER — OXYCODONE HCL 5 MG PO TABS: 2.5000 mg | ORAL_TABLET | ORAL | Status: DC | PRN

## 2021-10-28 MED ORDER — CALCIUM GLUCONATE 10 % IV SOLN
1.0000 g | Freq: Once | INTRAVENOUS | Status: AC
  Administered 2021-10-28: 1 g via INTRAVENOUS
  Filled 2021-10-28: qty 10

## 2021-10-28 MED ORDER — BENZTROPINE MESYLATE 1 MG PO TABS
2.0000 mg | ORAL_TABLET | Freq: Every day | ORAL | Status: DC
  Administered 2021-10-28 – 2021-10-29 (×2): 2 mg via ORAL
  Filled 2021-10-28 (×2): qty 2

## 2021-10-28 MED ORDER — MAGNESIUM SULFATE 2 GM/50ML IV SOLN
2.0000 g | Freq: Once | INTRAVENOUS | Status: AC
Start: 2021-10-28 — End: 2021-10-28
  Administered 2021-10-28: 2 g via INTRAVENOUS
  Filled 2021-10-28: qty 50

## 2021-10-28 MED ORDER — INSULIN ASPART 100 UNIT/ML IJ SOLN
0.0000 [IU] | Freq: Three times a day (TID) | INTRAMUSCULAR | Status: DC
  Administered 2021-10-29: 3 [IU] via SUBCUTANEOUS
  Administered 2021-10-29 – 2021-10-30 (×3): 2 [IU] via SUBCUTANEOUS

## 2021-10-28 MED ORDER — OLANZAPINE 5 MG PO TABS
10.0000 mg | ORAL_TABLET | Freq: Every day | ORAL | Status: DC
Start: 2021-10-28 — End: 2021-10-30
  Administered 2021-10-28 – 2021-10-29 (×2): 10 mg via ORAL
  Filled 2021-10-28 (×2): qty 2

## 2021-10-28 NOTE — Assessment & Plan Note (Addendum)
--   IV replacement ordered ?-- additional 4 gm ordered for later today for total of 6 gm IV on 4/17 ?-- repleted  ?

## 2021-10-28 NOTE — Hospital Course (Addendum)
56 year old female with known intellectual disability living at a group home, hypertension, type 2 diabetes mellitus, anemia, bronchitis, behavioral disturbances managed by psychiatrist, history of left breast biopsy and colonoscopy in 2017 presented to the emergency department after fall this morning at her group home.  She reportedly was going to the bathroom and had complained of weakness in her legs and ended up falling down.  The group home was concerned that she may have had a UTI which has caused these falls and similar symptoms in the past.  Patient apparently was assisted to the floor by staff at the facility.  She had no head injuries and no loss of consciousness.  Of note, group home reports frequent falls.  Group home also reported patient had several loose stools and was incontinent of stool when she fell down which she has done in the past.  She had dark stools that tested guaiac negative but she also takes twice daily iron supplementation. ? ?Her work-up in the emergency department revealed guaiac negative stool, EKG unremarkable, TSH within normal limits, hemoglobin 11.5, WBC 7.1, potassium 2.2, glucose 112, creatinine 1.72, calcium 5.5, albumin 3.7, magnesium 0.7.  Her anion gap was 16.  Patient was given supplemental potassium and magnesium and hospitalization was requested for further management. ? ?10/29/2021:  calcium remains very low, magnesium repleted, potassium remains very low, patient slowly starting to feel better and mentation slowly improving to baseline.  Continue current treatments. Trace amt of vaginal bleeding reported.  Instructed RN to monitor and record if any further bleeding.  ?

## 2021-10-28 NOTE — Assessment & Plan Note (Signed)
--    Continue amlodipine but holding home losartan/HCT ?

## 2021-10-28 NOTE — Assessment & Plan Note (Signed)
--   IV fluid replacement ordered, hold diuretics ?

## 2021-10-28 NOTE — Assessment & Plan Note (Addendum)
--   Patient poor historian but is able to vocalize her needs ?-- She is agreeable to receiving treatment in the hospital ?-- plan to return to group home in 1-2 days, possible 4/19 if stable ?

## 2021-10-28 NOTE — ED Triage Notes (Signed)
Pt brought in by [facility] from [facility] in [place] with c/o fall this morning while trying to get to the restroom. Pt reports her legs feel a little weak, but nothing unusual. Staff was able to assist pt down to the floor. Denies hitting her head or use of blood thinners.  ?

## 2021-10-28 NOTE — Assessment & Plan Note (Addendum)
--   suspect prerenal dehydration  ?-- IV fluids ordered and hold home losartan/ HCT ?-- slowly improving, continue IV fluid today ?

## 2021-10-28 NOTE — ED Provider Notes (Signed)
?[facility] EMERGENCY DEPARTMENT ?Provider Note ? ? ?CSN: [id number] ?Arrival date & time: 10/28/21  [id number] ? ?  ? ?History ? ?Chief Complaint  ?Patient presents with  ? Fall  ? ? ?Terri Bauer is a 56 y.o. female. ? ? ?Fall ?Pertinent negatives include no chest pain, no abdominal pain, no headaches and no shortness of breath.  ? ?  ? ?Terri Bauer is a 56 y.o. female with past medical history of hypertension, type 2 diabetes and intellectual delay who [facility] presents to the Emergency Department for evaluation of fall this morning.  Patient states that she has some weakness of her legs that has been persistent for several weeks.  States that she fell while trying to go to the restroom this morning.  Patient was assisted to the floor by facility staff, no head injury or LOC.  She endorses frequent falls recently and states that she has intermittent cramps of her muscles.  Per group home staff, patient has had several loose stools and she becomes incontinent of stool when she falls.  Patient's aunt is at bedside and is concerned she may have a urinary tract infection and states her stools have been dark.  Patient denies any symptoms at this time. ? ?Home Medications ?Prior to Admission medications   ?Medication Sig Start Date End Date Taking? Authorizing Provider  ?amLODipine (NORVASC) 10 MG tablet Take 10 mg by mouth daily. (0800)    [provider]  ?benztropine (COGENTIN) 1 MG tablet Take 1 tablet (1 mg total) by mouth daily at 8 pm. 12/06/19   Neysa HotterHisada, Reina, MD  ?Elwin SleightULERA 200-5 MCG/ACT AERO Inhale 2 puffs into the lungs in the morning and at bedtime. (0800 & 2000) 08/24/19   [provider]  ?ferrous sulfate 325 (65 FE) MG tablet Take 325 mg by mouth in the morning and at bedtime. (0800 & 2000) 09/19/19   [provider]  ?fluticasone (FLONASE) 50 MCG/ACT nasal spray Place 1 spray into both nostrils daily. (0800) 06/23/19   [provider]  ?ibuprofen  (ADVIL) 600 MG tablet Take 600 mg by mouth every 6 (six) hours as needed (for pain.).    [provider]  ?ketoconazole (NIZORAL) 2 % cream Apply 1 application topically 2 (two) times daily. (0800 & 2000) 08/30/19   [provider]  ?losartan-hydrochlorothiazide (HYZAAR) 50-12.5 MG tablet Take 1 tablet by mouth daily. (0800)    [provider]  ?metFORMIN (GLUCOPHAGE) 500 MG tablet Take 500 mg by mouth 2 (two) times daily with a meal. (0800 & 1700)    [provider]  ?OLANZapine (ZYPREXA) 7.5 MG tablet Take 1 tablet (7.5 mg total) by mouth at bedtime. 03/06/20 06/04/20  Neysa HotterHisada, Reina, MD  ?omeprazole (PRILOSEC) 20 MG capsule TAKE ONE CAPSULE BY MOUTH TWICE DAILY FOR 3 MONTHS THEN 1 CAPSULE EVERY DAY ?Patient taking differently: Take 20 mg by mouth daily. (0800) 05/16/16   Anice PaganiniGill, Eric A, NP  ?potassium chloride SA (KLOR-CON) 20 MEQ tablet Take 1 tablet (20 mEq total) by mouth daily. ?Patient taking differently: Take 20 mEq by mouth daily. (0800) 07/20/19   Arthor CaptainHarris, Abigail, PA-C  ?   ? ?Allergies    ?Patient has no known allergies.   ? ?Review of Systems   ?Review of Systems  ?Constitutional:  Negative for chills and fever.  ?Eyes:  Negative for visual disturbance.  ?Respiratory:  Negative for chest tightness and shortness of breath.   ?Cardiovascular:  Negative for chest pain.  ?  Gastrointestinal:  Negative for abdominal pain, nausea and vomiting.  ?Genitourinary:  Negative for difficulty urinating and dysuria.  ?Musculoskeletal:  Negative for arthralgias, back pain and neck pain.  ?Neurological:  Positive for weakness. Negative for dizziness, seizures, syncope and headaches.  ?All other systems reviewed and are negative. ? ?Physical Exam ?Updated Vital Signs ?BP 125/73   Pulse 84   Temp 98.6 ?F (37 ?C) (Oral)   Resp 18   Ht 5\' 1"  (1.549 m)   Wt 111.1 kg   SpO2 96%   BMI 46.29 kg/m?  ?Physical Exam ?Vitals and nursing note reviewed.  ?Constitutional:   ?   General: She is not in  acute distress. ?   Appearance: Normal appearance. She is not ill-appearing or toxic-appearing.  ?HENT:  ?   Head: Atraumatic.  ?   Mouth/Throat:  ?   Mouth: Mucous membranes are moist.  ?   Pharynx: Oropharynx is clear.  ?Eyes:  ?   Extraocular Movements: Extraocular movements intact.  ?   Conjunctiva/sclera: Conjunctivae normal.  ?   Pupils: Pupils are equal, round, and reactive to light.  ?Cardiovascular:  ?   Rate and Rhythm: Normal rate and regular rhythm.  ?   Pulses: Normal pulses.  ?Pulmonary:  ?   Effort: Pulmonary effort is normal.  ?Chest:  ?   Chest wall: No tenderness.  ?Abdominal:  ?   Palpations: Abdomen is soft.  ?   Tenderness: There is no abdominal tenderness.  ?Genitourinary: ?   Rectum: Guaiac result negative.  ?   Comments: Patient's clothing soiled with feces on arrival.  Stool appears dark in color, no hematochezia.  Guaiac negative. ?Musculoskeletal:     ?   General: No swelling, tenderness or signs of injury.  ?   Right lower leg: No edema.  ?   Left lower leg: No edema.  ?Skin: ?   General: Skin is warm.  ?   Capillary Refill: Capillary refill takes less than 2 seconds.  ?Neurological:  ?   General: No focal deficit present.  ?   Mental Status: She is alert.  ?   GCS: GCS eye subscore is 4. GCS verbal subscore is 5. GCS motor subscore is 6.  ?   Sensory: Sensation is intact. No sensory deficit.  ?   Motor: Motor function is intact. No weakness.  ?   Comments: CN III through XII grossly intact.  Speech clear.  No pronator drift.  ? ? ?ED Results / Procedures / Treatments   ?Labs ?(all labs ordered are listed, but only abnormal results are displayed) ?Labs Reviewed  ?CBC WITH DIFFERENTIAL/PLATELET - Abnormal; Notable for the following components:  ?    Result Value  ? RBC 3.58 (*)   ? Hemoglobin 11.5 (*)   ? MCV 101.4 (*)   ? All other components within normal limits  ?COMPREHENSIVE METABOLIC PANEL - Abnormal; Notable for the following components:  ? Potassium 2.2 (*)   ? Chloride 96 (*)   ?  Glucose, Bld 112 (*)   ? Creatinine, Ser 1.72 (*)   ? Calcium 5.5 (*)   ? Total Protein 8.2 (*)   ? AST 117 (*)   ? GFR, Estimated 34 (*)   ? Anion gap 16 (*)   ? All other components within normal limits  ?CALCIUM, IONIZED  ?TSH  ?MAGNESIUM  ?POC OCCULT BLOOD, ED  ?TYPE AND SCREEN  ? ? ?EKG ?EKG Interpretation ? ?Date/Time:  Monday October 28 2021 09:33:59 EDT ?  Ventricular Rate:  86 ?PR Interval:  190 ?QRS Duration: 104 ?QT Interval:  433 ?QTC Calculation: 518 ?R Axis:   37 ?Text Interpretation: Sinus rhythm Low voltage, precordial leads Prolonged QT interval No acute changes Confirmed by Derwood Kaplan [phone]) on 10/28/2021 10:34:44 AM ? ?Radiology ?DG Chest Portable 1 View ? ?Result Date: 10/28/2021 ?CLINICAL DATA:  Generalized weakness, fall EXAM: PORTABLE CHEST 1 VIEW COMPARISON:  12/12/2013 FINDINGS: Transverse diameter of heart is increased. There are no signs of pulmonary edema or focal pulmonary consolidation. There is no pleural effusion or pneumothorax. IMPRESSION: Cardiomegaly. There are no signs of pulmonary edema or focal pulmonary consolidation. Electronically Signed   By: Ernie Avena M.D.   On: 10/28/2021 09:46   ? ?Procedures ?Procedures  ? ? ?CRITICAL CARE ?Performed by: Anessa Charley ?Total critical care time: 35 minutes ?Critical care time was exclusive of separately billable procedures and treating other patients. ?Critical care was necessary to treat or prevent imminent or life-threatening deterioration. ?Critical care was time spent personally by me on the following activities: development of treatment plan with patient and/or surrogate as well as nursing, discussions with consultants, evaluation of patient's response to treatment, examination of patient, obtaining history from patient or surrogate, ordering and performing treatments and interventions, ordering and review of laboratory studies, ordering and review of radiographic studies, pulse oximetry and re-evaluation of patient's  condition. ? ? ?Medications Ordered in ED ?Medications  ?potassium chloride SA (KLOR-CON M) CR tablet 40 mEq (has no administration in time range)  ?potassium chloride 10 mEq in 100 mL IVPB (has no administration in

## 2021-10-28 NOTE — Assessment & Plan Note (Signed)
--   PT eval when medically stabilized  ?

## 2021-10-28 NOTE — Assessment & Plan Note (Signed)
--   holding home losartan HCT for now ?

## 2021-10-28 NOTE — H&P (Signed)
?History and Physical  ?[facility] ? ?Terri Bauer ZOX:[id number] DOB: [birth year] DOA: 10/28/2021 ? ?PCP: Benetta SparFanta, Tesfaye Demissie, MD  ?Patient coming from: Group Home  ?Level of care: Med-Surg ? ?I have personally briefly reviewed patient[facility] ? ?Chief Complaint: Fall  ? 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  She reportedly was going to the bathroom and had complained of weakness in her legs and ended up falling down.  The group home was concerned that she may have had a UTI which has caused these falls and similar symptoms in the past.  Patient apparently was assisted to the floor by staff at the facility.  She had no head injuries and no loss of consciousness.  Of note, group home reports frequent falls.  Group home also reported patient had several loose stools and was incontinent of stool when she fell down which she has done in the past.  She had dark stools that tested guaiac negative but she also takes twice daily iron supplementation. ? ?Her work-up in the emergency department revealed guaiac negative stool, EKG unremarkable, TSH within normal limits, hemoglobin 11.5, WBC 7.1, potassium 2.2, glucose 112, creatinine 1.72, calcium 5.5, albumin 3.7, magnesium 0.7.  Her anion gap was 16.  Patient was given supplemental potassium and magnesium and hospitalization was requested for further management. ? ?Review of Systems: Review of Systems  ?Constitutional:  Positive for malaise/fatigue. Negative for diaphoresis, fever and weight loss.  ?HENT: Negative.    ?Eyes: Negative.   ?Respiratory:  Negative for cough, sputum production and shortness of breath.    ?Cardiovascular:  Negative for chest pain, palpitations, orthopnea, claudication and PND.  ?Gastrointestinal:  Positive for heartburn, melena and nausea. Negative for abdominal pain, blood in stool, diarrhea and vomiting.  ?Genitourinary: Negative.  Negative for dysuria, frequency, hematuria and urgency.  ?Musculoskeletal:  Positive for falls and myalgias.  ?Skin:  Positive for rash.  ?Neurological:  Positive for focal weakness and weakness.  ?Endo/Heme/Allergies: Negative.   ?Psychiatric/Behavioral:  Positive for memory loss. The patient is nervous/anxious.   ?All other systems reviewed and are negative. ?  ?Past Medical History:  ?Diagnosis Date  ? Bronchitis   ? Diabetes (HCC)   ? Hypertension   ? Intellectual delay   ? Other intellectual disabilities   ? sees psychiatrist  ? Poor historian   ? limited due to intellectual disabilities.  ? ? ?Past Surgical History:  ?Procedure Laterality Date  ? BREAST BIOPSY Left 11/15/2019  ? Procedure: BREAST BIOPSY WITH NEEDLE LOCALIZATION;  Surgeon: Franky MachoJenkins, Mark, MD;  Location: [facility];  Service: General;  Laterality: Left;  ? COLONOSCOPY WITH PROPOFOL N/A 10/09/2015  ? Procedure: COLONOSCOPY WITH PROPOFOL;  Surgeon: West BaliSandi L Fields, MD;  Location: [facility] END[facility] SU[facility];  Service: Endoscopy;  Laterality: N/A;  1100-moved to 845 per Ginger  ? none    ? ? ? reports that she has never smoked. She has never used smokeless tobacco. She reports that she does not drink alcohol and does not use drugs. ? ?No Known Allergies ? ?Family History  ?Problem Relation Age of Onset  ? Colon cancer Father   ?     diagnosed in his 6260s, deceased   ? Diabetes Brother   ?  Schizophrenia Brother   ? ? ?Prior to Admission medications   ?Medication Sig Start Date End Date Taking? Authorizing Provider  ?amLODipine (NORVASC) 10 MG tablet Take 10 mg by mouth daily. (0800)   Yes [provider]  ?atorvastatin (LIPITOR) 40 MG tablet Take 40 mg by mouth daily. 10/18/21  Yes [provider]  ?benztropine  (COGENTIN) 1 MG tablet Take 1 tablet (1 mg total) by mouth daily at 8 pm. ?Patient taking differently: Take 2 mg by mouth at bedtime. 12/06/19  Yes Neysa Hotter, MD  ?Elwin Sleight 200-5 MCG/ACT AERO Inhale 2 puffs into the lungs in the morning and at bedtime. (0800 & 2000) 08/24/19  Yes [provider]  ?ferrous sulfate 325 (65 FE) MG tablet Take 325 mg by mouth in the morning and at bedtime. (0800 & 2000) 09/19/19  Yes [provider]  ?fluticasone (FLONASE) 50 MCG/ACT nasal spray Place 1 spray into both nostrils daily. (0800) 06/23/19  Yes [provider]  ?ketoconazole (NIZORAL) 2 % cream Apply 1 application topically 2 (two) times daily. (0800 & 2000) 08/30/19  Yes [provider]  ?losartan-hydrochlorothiazide (HYZAAR) 50-12.5 MG tablet Take 1 tablet by mouth daily. (0800)   Yes [provider]  ?metFORMIN (GLUCOPHAGE) 500 MG tablet Take 500 mg by mouth 2 (two) times daily with a meal. (0800 & 1700)   Yes [provider]  ?OLANZapine (ZYPREXA) 10 MG tablet Take 10 mg by mouth at bedtime. 10/18/21  Yes [provider]  ?omeprazole (PRILOSEC) 20 MG capsule TAKE ONE CAPSULE BY MOUTH TWICE DAILY FOR 3 MONTHS THEN 1 CAPSULE EVERY DAY ?Patient taking differently: Take 20 mg by mouth daily. (0800) 05/16/16  Yes Anice Paganini, NP  ?potassium chloride SA (KLOR-CON) 20 MEQ tablet Take 1 tablet (20 mEq total) by mouth daily. ?Patient taking differently: Take 20 mEq by mouth daily. (0800) 07/20/19  Yes Arthor Captain, PA-C  ?OLANZapine (ZYPREXA) 7.5 MG tablet Take 1 tablet (7.5 mg total) by mouth at bedtime. ?Patient not taking: Reported on 10/28/2021 03/06/20 06/04/20  Neysa Hotter, MD  ? ? ?Physical Exam: ?Vitals:  ? 10/28/21 1100 10/28/21 1130 10/28/21 1200 10/28/21 1230  ?BP: 123/84 127/77 135/79 131/80  ?Pulse: 86 81 78 79  ?Resp: (!) 25 16 10 10   ?Temp:      ?TempSrc:      ?SpO2: 100% 99% 94% 96%  ?Weight:      ?Height:      ? ? ?Constitutional: lying supine, NAD, calm,  comfortable ?Eyes: PERRL, lids and conjunctivae normal ?ENMT: Mucous membranes are dry. Posterior pharynx clear of any exudate or lesions.Normal dentition.  ?Neck: normal, supple, no masses, no thyromegaly ?Respiratory: clear to auscultation bilaterally, no wheezing, no crackles. Normal respiratory effort. No accessory muscle use.  ?Cardiovascular: normal s1, s2 sounds, no murmurs / rubs / gallops. No extremity edema. 2+ pedal pulses. No carotid bruits.  ?Abdomen: no tenderness, no masses palpated. No hepatosplenomegaly. Bowel sounds positive.  ?Musculoskeletal: no clubbing / cyanosis. No joint deformity upper and lower extremities. Good ROM, no contractures. Normal muscle tone.  ?Skin: no rashes, lesions, ulcers. No induration ?Neurologic: CN 2-12 grossly intact. Sensation intact, DTR normal. Strength 5/5 in all 4.  ?Psychiatric: poor judgment and insight. Alert and oriented x 3. Normal mood.  ? ?Labs on Admission: I have personally reviewed following labs and imaging studies ? ?CBC: ?Recent Labs  ?Lab 10/28/21 ?0946  ?WBC 7.1  ?NEUTROABS 5.5  ?HGB 11.5*  ?HCT 36.3  ?MCV 101.4*  ?  PLT 289  ? ?Basic Metabolic Panel: ?Recent Labs  ?Lab 10/28/21 ?0946 10/28/21 ?1146  ?NA 143  --   ?K 2.2*  --   ?CL 96*  --   ?CO2 31  --   ?GLUCOSE 112*  --   ?BUN 10  --   ?CREATININE 1.72*  --   ?CALCIUM 5.5*  --   ?MG  --  0.7*  ? ?GFR: ?Estimated Creatinine Clearance: 42.1 mL/min (A) (by C-G formula based on SCr of 1.72 mg/dL (H)). ?Liver Function Tests: ?Recent Labs  ?Lab 10/28/21 ?0946  ?AST 117*  ?ALT 29  ?ALKPHOS 90  ?BILITOT 0.8  ?PROT 8.2*  ?ALBUMIN 3.7  ? ?No results for input(s): LIPASE, AMYLASE in the last 168 hours. ?No results for input(s): AMMONIA in the last 168 hours. ?Coagulation Profile: ?No results for input(s): INR, PROTIME in the last 168 hours. ?Cardiac Enzymes: ?No results for input(s): CKTOTAL, CKMB, CKMBINDEX, TROPONINI in the last 168 hours. ?BNP (last 3 results) ?No results for input(s): PROBNP in the last  8760 hours. ?HbA1C: ?No results for input(s): HGBA1C in the last 72 hours. ?CBG: ?No results for input(s): GLUCAP in the last 168 hours. ?Lipid Profile: ?No results for input(s): CHOL, HDL, LDLCALC, TRIG, CH

## 2021-10-28 NOTE — Assessment & Plan Note (Signed)
--   Working on correcting magnesium and replacing potassium. ?-- Temporarily holding home diuretic therapy ?-- IV potassium ordered. ?-- Follow BMP closely ?

## 2021-10-28 NOTE — Assessment & Plan Note (Signed)
--   Caretakers report that this usually occurs with episodes of falling.  Monitor closely. ?

## 2021-10-28 NOTE — ED Notes (Signed)
Pt covered in black stool upon triage. Pt cleansed and clean diaper placed on pt.  ?

## 2021-10-28 NOTE — Assessment & Plan Note (Addendum)
--   holding home oral metformin in setting of AKI ?-- SSI coverage ordered and CBG monitoring ?CBG (last 3)  ?Recent Labs  ?  10/28/21 ?1756 10/29/21 ?0735 10/29/21 ?1126  ?GLUCAP 87 127* 152*  ? ? ?

## 2021-10-28 NOTE — Assessment & Plan Note (Signed)
--   very low calcium level, treated with IV and oral replacement ?-- replacing magnesium  ?-- TUMS TID with meals ?-- anticipate may need additional IV calcium in AM ?

## 2021-10-28 NOTE — ED Notes (Signed)
Pt's O2 sat ranging between 87-91% on RA. Pt reports feeling SOB. 2L O2 via [place] placed on pt, with O2 sat's increasing to 94%. Pt reports her breathing feels better with the oxygen.  ?

## 2021-10-29 ENCOUNTER — Other Ambulatory Visit: Payer: Self-pay

## 2021-10-29 DIAGNOSIS — I10 Essential (primary) hypertension: Secondary | ICD-10-CM | POA: Diagnosis not present

## 2021-10-29 DIAGNOSIS — N179 Acute kidney failure, unspecified: Secondary | ICD-10-CM | POA: Diagnosis not present

## 2021-10-29 DIAGNOSIS — E876 Hypokalemia: Secondary | ICD-10-CM | POA: Diagnosis not present

## 2021-10-29 LAB — COMPREHENSIVE METABOLIC PANEL
ALT: 23 U/L (ref 0–44)
AST: 76 U/L — ABNORMAL HIGH (ref 15–41)
Albumin: 3.4 g/dL — ABNORMAL LOW (ref 3.5–5.0)
Alkaline Phosphatase: 86 U/L (ref 38–126)
Anion gap: 12 (ref 5–15)
BUN: 9 mg/dL (ref 6–20)
CO2: 28 mmol/L (ref 22–32)
Calcium: 5.6 mg/dL — CL (ref 8.9–10.3)
Chloride: 101 mmol/L (ref 98–111)
Creatinine, Ser: 1.55 mg/dL — ABNORMAL HIGH (ref 0.44–1.00)
GFR, Estimated: 39 mL/min — ABNORMAL LOW (ref >60)
Glucose, Bld: 133 mg/dL — ABNORMAL HIGH (ref 70–99)
Potassium: 2.5 mmol/L — CL (ref 3.5–5.1)
Sodium: 141 mmol/L (ref 135–145)
Total Bilirubin: 0.5 mg/dL (ref 0.3–1.2)
Total Protein: 7.1 g/dL (ref 6.5–8.1)

## 2021-10-29 LAB — CBC
HCT: 34.3 % — ABNORMAL LOW (ref 36.0–46.0)
Hemoglobin: 10.5 g/dL — ABNORMAL LOW (ref 12.0–15.0)
MCH: 31.4 pg (ref 26.0–34.0)
MCHC: 30.6 g/dL (ref 30.0–36.0)
MCV: 102.7 fL — ABNORMAL HIGH (ref 80.0–100.0)
Platelets: 266 10*3/uL (ref 150–400)
RBC: 3.34 MIL/uL — ABNORMAL LOW (ref 3.87–5.11)
RDW: 13.9 % (ref 11.5–15.5)
WBC: 8.7 10*3/uL (ref 4.0–10.5)
nRBC: 0 % (ref 0.0–0.2)

## 2021-10-29 LAB — PHOSPHORUS: Phosphorus: 3.7 mg/dL (ref 2.5–4.6)

## 2021-10-29 LAB — MAGNESIUM: Magnesium: 2.4 mg/dL (ref 1.7–2.4)

## 2021-10-29 LAB — GLUCOSE, CAPILLARY
Glucose-Capillary: 127 mg/dL — ABNORMAL HIGH (ref 70–99)
Glucose-Capillary: 152 mg/dL — ABNORMAL HIGH (ref 70–99)
Glucose-Capillary: 98 mg/dL (ref 70–99)
Glucose-Capillary: 138 mg/dL — ABNORMAL HIGH (ref 70–99)

## 2021-10-29 LAB — HIV ANTIBODY (ROUTINE TESTING W REFLEX): HIV Screen 4th Generation wRfx: NONREACTIVE

## 2021-10-29 LAB — CALCIUM, IONIZED: Calcium, Ionized, Serum: <3 mg/dL — ABNORMAL LOW (ref 4.5–5.6)

## 2021-10-29 MED ORDER — CALCIUM GLUCONATE 10 % IV SOLN
1.0000 g | Freq: Once | INTRAVENOUS | Status: AC
Start: 2021-10-29 — End: 2021-10-29
  Administered 2021-10-29: 1 g via INTRAVENOUS
  Filled 2021-10-29: qty 10

## 2021-10-29 MED ORDER — POTASSIUM CHLORIDE 10 MEQ/100ML IV SOLN
10.0000 meq | INTRAVENOUS | Status: AC
  Administered 2021-10-29 (×4): 10 meq via INTRAVENOUS
  Filled 2021-10-29 (×4): qty 100

## 2021-10-29 MED ORDER — POTASSIUM CHLORIDE 20 MEQ PO PACK
40.0000 meq | PACK | Freq: Once | ORAL | Status: AC
Start: 2021-10-29 — End: 2021-10-29
  Administered 2021-10-29: 40 meq via ORAL
  Filled 2021-10-29: qty 2

## 2021-10-29 MED ORDER — CALCIUM GLUCONATE 10 % IV SOLN: 2.0000 g | Freq: Once | INTRAVENOUS | Status: DC

## 2021-10-29 MED ORDER — CALCIUM GLUCONATE-NACL 1-0.675 GM/50ML-% IV SOLN
1.0000 g | Freq: Once | INTRAVENOUS | Status: AC
  Administered 2021-10-29: 1000 mg via INTRAVENOUS
  Filled 2021-10-29: qty 50

## 2021-10-29 NOTE — TOC Initial Note (Addendum)
Transition of Care (TOC) - Initial/Assessment Note  ? ? ?Patient Details  ?Name: Terri Bauer ?MRN: [id number] ?Date of Birth: April 18, 1966 ? ?Transition of Care (TOC) CM/SW Contact:    ?Elliot Gault, LCSW ?Phone Number: ?10/29/2021, 8:52 AM ? ?Clinical Narrative:                 ? ?Pt admitted from [facility]. Pt has a high readmission risk score. Spoke with pt's aunt today to assess. Per aunt, she is pt's payee but pt is her own guardian. Plan is for return to [facility] at dc. Pt's aunt states she can transport pt at dc if the [facility] cannot. Pt is mostly independent in ADLs at the [facility]. MD anticipating possible dc tomorrow.  ? ?Attempted to contact [facility] owner, Willaim Rayas, though could only leave a voicemail message. ? ?TOC will follow up in AM. ? ?1513: TOC received return call from [facility] at the [facility]. Updated given. Corrie Dandy says the [facility] will transport at dc. She would like FL2 printed and provided at dc, it does not need to be faxed. Pt receives minimal assistance with ADLs at the [facility]. ? ?Expected Discharge Plan: Group Home ?Barriers to Discharge: Continued Medical Work up ? ? ?Patient Goals and CMS Choice ?Patient states their goals for this hospitalization and ongoing recovery are:: go home ?  ?  ? ?Expected Discharge Plan and Services ?Expected Discharge Plan: Group Home ?In-house Referral: Clinical Social Work ?  ?  ?Living arrangements for the past 2 months: Group Home ?                ?  ?  ?  ?  ?  ?  ?  ?  ?  ?  ? ?Prior Living Arrangements/Services ?Living arrangements for the past 2 months: Group Home ?Lives with:: Facility Resident ?Patient language and need for interpreter reviewed:: Yes ?Do you feel safe going back to the place where you live?: Yes      ?Need for Family Participation in Patient Care: No (Comment) ?Care giver support system in place?: Yes (comment) ?  ?Criminal Activity/Legal Involvement Pertinent to Current Situation/Hospitalization: No - Comment as needed ? ?Activities of Daily Living ?Home  Assistive Devices/Equipment: CBG Meter ?ADL Screening (condition at time of admission) ?Patient's cognitive ability adequate to safely complete daily activities?: Yes ?Is the patient deaf or have difficulty hearing?: No ?Does the patient have difficulty seeing, even when wearing glasses/contacts?: No ?Does the patient have difficulty concentrating, remembering, or making decisions?: No ?Patient able to express need for assistance with ADLs?: Yes ?Does the patient have difficulty dressing or bathing?: No ?Independently performs ADLs?: Yes (appropriate for developmental age) ?Does the patient have difficulty walking or climbing stairs?: No ?Weakness of Legs: None ?Weakness of Arms/Hands: None ? ?Permission Sought/Granted ?  ?  ?   ?   ?   ?   ? ?Emotional Assessment ?  ?  ?  ?Orientation: : Oriented to Self, Oriented to Place, Oriented to  Time, Oriented to Situation ?Alcohol / Substance Use: Not Applicable ?Psych Involvement: No (comment) ? ?Admission diagnosis:  Hypocalcemia [E83.51] ?Hypokalemia [E87.6] ?Patient Active Problem List  ? Diagnosis Date Noted  ? Hypocalcemia 10/28/2021  ? Hypokalemia 10/28/2021  ? Hypomagnesemia 10/28/2021  ? Intellectual disability 10/28/2021  ? AKI (acute kidney injury) (HCC) 10/28/2021  ? Severe dehydration 10/28/2021  ? Diuretic-induced hypokalemia 10/28/2021  ? Fall at home 10/28/2021  ? Fecal incontinence 10/28/2021  ? Essential hypertension 10/28/2021  ? Type 2  diabetes mellitus (HCC) 10/28/2021  ? Sclerosing adenosis of left breast   ? Abnormal mammogram of left breast 09/21/2019  ? Screening for colorectal cancer 09/21/2019  ? Encounter for gynecological examination with Papanicolaou smear of cervix 09/21/2019  ? Encounter for screening colonoscopy 09/21/2015  ? FH: colon cancer 09/21/2015  ? ?PCP:  Benetta Spar, MD ?Pharmacy:   ?[facility] - [place], [place] - [address] ?[address] ?[place] [address] ?Phone: [phone] Fax: [phone] ? ? ? ? ?Social  Determinants of Health (SDOH) Interventions ?  ? ?Readmission Risk Interventions ?   ? View : No data to display.  ?  ?  ?  ? ? ? ?

## 2021-10-29 NOTE — Progress Notes (Signed)
?PROGRESS NOTE ? ? ?Terri Bauer  OG:[id number] DOB: [birth year] DOA: 10/28/2021 ?PCP: Carrolyn Meiers, MD  ? ?Chief Complaint  ?Patient presents with  ? Fall  ? ?Level of care: Med-Surg ? ?Brief Admission History:  ?56 year old female with known intellectual disability living at a group home, hypertension, type 2 diabetes mellitus, anemia, bronchitis, behavioral disturbances managed by psychiatrist, history of left breast biopsy and colonoscopy in 2017 presented to the emergency department after fall this morning at her group home.  She reportedly was going to the bathroom and had complained of weakness in her legs and ended up falling down.  The group home was concerned that she may have had a UTI which has caused these falls and similar symptoms in the past.  Patient apparently was assisted to the floor by staff at the facility.  She had no head injuries and no loss of consciousness.  Of note, group home reports frequent falls.  Group home also reported patient had several loose stools and was incontinent of stool when she fell down which she has done in the past.  She had dark stools that tested guaiac negative but she also takes twice daily iron supplementation. ? ?Her work-up in the emergency department revealed guaiac negative stool, EKG unremarkable, TSH within normal limits, hemoglobin 11.5, WBC 7.1, potassium 2.2, glucose 112, creatinine 1.72, calcium 5.5, albumin 3.7, magnesium 0.7.  Her anion gap was 16.  Patient was given supplemental potassium and magnesium and hospitalization was requested for further management. ? ?10/29/2021:  calcium remains very low, magnesium repleted, potassium remains very low, patient slowly starting to feel better and mentation slowly improving to baseline.  Continue current treatments. Trace amt of vaginal bleeding reported.  Instructed RN to monitor and record if any further bleeding.  ?  ?Assessment and Plan: ?* Hypocalcemia ?-- very low calcium level, treated  with IV and oral replacement ?-- replacing magnesium  ?-- TUMS TID with meals ?-- anticipate may need additional IV calcium in AM ? ?Hypokalemia ?-- Working on correcting magnesium and replacing potassium. ?-- Temporarily holding home diuretic therapy ?-- IV potassium ordered. ?-- Follow BMP closely ? ?Type 2 diabetes mellitus ([place]) ?-- holding home oral metformin in setting of AKI ?-- SSI coverage ordered and CBG monitoring ?CBG (last 3)  ?Recent Labs  ?  10/28/21 ?1756 10/29/21 ?0735 10/29/21 ?1126  ?GLUCAP 87 127* 152*  ? ? ? ?Essential hypertension ?--  Continue amlodipine but holding home losartan/HCT ? ?Fecal incontinence ?-- Caretakers report that this usually occurs with episodes of falling.  Monitor closely. ? ?Fall at home ?-- PT eval when medically stabilized  ? ?Diuretic-induced hypokalemia ?-- holding home losartan HCT for now ? ?Severe dehydration ?-- IV fluid replacement ordered, hold diuretics ? ?AKI (acute kidney injury) ([place]) ?-- suspect prerenal dehydration  ?-- IV fluids ordered and hold home losartan/ HCT ?-- slowly improving, continue IV fluid today ? ?Intellectual disability ?-- Patient poor historian but is able to vocalize her needs ?-- She is agreeable to receiving treatment in the hospital ?-- plan to return to group home in 1-2 days, possible 4/19 if stable ? ?Hypomagnesemia ?-- IV replacement ordered ?-- additional 4 gm ordered for later today for total of 6 gm IV on 4/17 ?-- repleted  ? ? ?DVT prophylaxis: enoxaparin  ?Code Status: full  ?Family Communication:  ?Disposition: Status is: Inpatient ?Remains inpatient appropriate because: IV fluid, IV electrolyte replacement needed  ?  ?Consultants:  ? ?Procedures:  ? ?Antimicrobials:  ?  ?  Subjective: ?Pt sitting up in chair, she says she is starting to feel better and she is able to eat today and drink.  ?Objective: ?Vitals:  ? 10/29/21 0545 10/29/21 0732 10/29/21 1007 10/29/21 1243  ?BP: 112/74 (!) 142/59  117/86  ?Pulse: 90 99  95   ?Resp: 19 20  20   ?Temp: 98.3 ?F (36.8 ?C) 98.1 ?F (36.7 ?C)  98 ?F (36.7 ?C)  ?TempSrc:  Oral  Oral  ?SpO2: 98% 95% 94% 98%  ?Weight:      ?Height:      ? ? ?Intake/Output Summary (Last 24 hours) at 10/29/2021 1500 ?Last data filed at 10/29/2021 1243 ?Gross per 24 hour  ?Intake 590 ml  ?Output --  ?Net 590 ml  ? ?Filed Weights  ? 10/28/21 0842  ?Weight: 111.1 kg  ? ?Examination: ? ?General exam: Appears calm and comfortable. Membranes less dry today.   ?Respiratory system: Clear to auscultation. Respiratory effort normal. ?Cardiovascular system: normal S1 & S2 heard. No JVD, murmurs, rubs, gallops or clicks. No pedal edema. ?Gastrointestinal system: Abdomen is nondistended, soft and nontender. No organomegaly or masses felt. Normal bowel sounds heard. ?Central nervous system: Alert and oriented to person and place. No focal neurological deficits. ?Extremities: Symmetric 5 x 5 power. ?Skin: No rashes, lesions or ulcers. ?Psychiatry: Judgement and insight appear UTD. Mood & affect appropriate.  ? ?Data Reviewed: I have personally reviewed following labs and imaging studies ? ?CBC: ?Recent Labs  ?Lab 10/28/21 ?0946 10/29/21 ?0425  ?WBC 7.1 8.7  ?NEUTROABS 5.5  --   ?HGB 11.5* 10.5*  ?HCT 36.3 34.3*  ?MCV 101.4* 102.7*  ?PLT 289 266  ? ? ?Basic Metabolic Panel: ?Recent Labs  ?Lab 10/28/21 ?0946 10/28/21 ?1146 10/29/21 ?0425  ?NA 143  --  141  ?K 2.2*  --  2.5*  ?CL 96*  --  101  ?CO2 31  --  28  ?GLUCOSE 112*  --  133*  ?BUN 10  --  9  ?CREATININE 1.72*  --  1.55*  ?CALCIUM 5.5*  --  5.6*  ?MG  --  0.7* 2.4  ?PHOS  --   --  3.7  ? ? ?CBG: ?Recent Labs  ?Lab 10/28/21 ?1756 10/29/21 ?0735 10/29/21 ?1126  ?GLUCAP 87 127* 152*  ? ? ?No results found for this or any previous visit (from the past 240 hour(s)).  ? ?Radiology Studies: ?DG Chest Portable 1 View ? ?Result Date: 10/28/2021 ?CLINICAL DATA:  Generalized weakness, fall EXAM: PORTABLE CHEST 1 VIEW COMPARISON:  12/12/2013 FINDINGS: Transverse diameter of heart is  increased. There are no signs of pulmonary edema or focal pulmonary consolidation. There is no pleural effusion or pneumothorax. IMPRESSION: Cardiomegaly. There are no signs of pulmonary edema or focal pulmonary consolidation. Electronically Signed   By: Elmer Picker M.D.   On: 10/28/2021 09:46   ? ?Scheduled Meds: ? amLODipine  5 mg Oral Daily  ? atorvastatin  40 mg Oral QPM  ? benztropine  2 mg Oral QHS  ? calcium carbonate  1 tablet Oral TID WC  ? enoxaparin (LOVENOX) injection  50 mg Subcutaneous Q24H  ? ferrous sulfate  325 mg Oral Q breakfast  ? fluticasone  1 spray Each Nare Daily  ? insulin aspart  0-15 Units Subcutaneous TID WC  ? insulin aspart  2 Units Subcutaneous TID WC  ? ketoconazole  1 application. Topical BID  ? mometasone-formoterol  2 puff Inhalation BID  ? OLANZapine  10 mg Oral QHS  ? pantoprazole  40 mg Oral Daily  ? ?Continuous Infusions: ? 0.9 % NaCl with KCl 20 mEq / L 100 mL/hr at 10/28/21 1754  ? ? ? LOS: 1 day  ? ?Time spent: 36 mins ? ?Irwin Brakeman, MD ?How to contact the [facility] Attending or Consulting provider [place] or covering provider during after hours [place], for this patient?  ?Check the care team in [facility] and look for a) attending/consulting [facility] provider listed and b) the [facility] team listed ?Log into [email or web address] and use [place] universal password to access. If you do not have the password, please contact the hospital operator. ?Locate the [facility] provider you are looking for under [facility] and page to a number that you can be directly reached. ?If you still have difficulty reaching the provider, please page the [facility] (Director on Call) for the Hospitalists listed on amion for assistance. ? ?10/29/2021, 3:00 PM  ? ? ?

## 2021-10-30 LAB — PHOSPHORUS: Phosphorus: 2.9 mg/dL (ref 2.5–4.6)

## 2021-10-30 LAB — CBC
HCT: 30.7 % — ABNORMAL LOW (ref 36.0–46.0)
Hemoglobin: 9.7 g/dL — ABNORMAL LOW (ref 12.0–15.0)
MCH: 32.6 pg (ref 26.0–34.0)
MCHC: 31.6 g/dL (ref 30.0–36.0)
MCV: 103 fL — ABNORMAL HIGH (ref 80.0–100.0)
Platelets: 268 10*3/uL (ref 150–400)
RBC: 2.98 MIL/uL — ABNORMAL LOW (ref 3.87–5.11)
RDW: 14.3 % (ref 11.5–15.5)
WBC: 6.3 10*3/uL (ref 4.0–10.5)
nRBC: 0 % (ref 0.0–0.2)

## 2021-10-30 LAB — COMPREHENSIVE METABOLIC PANEL
ALT: 26 U/L (ref 0–44)
AST: 73 U/L — ABNORMAL HIGH (ref 15–41)
Albumin: 3.1 g/dL — ABNORMAL LOW (ref 3.5–5.0)
Alkaline Phosphatase: 80 U/L (ref 38–126)
Anion gap: 8 (ref 5–15)
BUN: 9 mg/dL (ref 6–20)
CO2: 27 mmol/L (ref 22–32)
Calcium: 6 mg/dL — CL (ref 8.9–10.3)
Chloride: 107 mmol/L (ref 98–111)
Creatinine, Ser: 1.44 mg/dL — ABNORMAL HIGH (ref 0.44–1.00)
GFR, Estimated: 43 mL/min — ABNORMAL LOW (ref >60)
Glucose, Bld: 126 mg/dL — ABNORMAL HIGH (ref 70–99)
Potassium: 3.1 mmol/L — ABNORMAL LOW (ref 3.5–5.1)
Sodium: 142 mmol/L (ref 135–145)
Total Bilirubin: 0.6 mg/dL (ref 0.3–1.2)
Total Protein: 6.6 g/dL (ref 6.5–8.1)

## 2021-10-30 LAB — GLUCOSE, CAPILLARY
Glucose-Capillary: 126 mg/dL — ABNORMAL HIGH (ref 70–99)
Glucose-Capillary: 131 mg/dL — ABNORMAL HIGH (ref 70–99)

## 2021-10-30 LAB — MAGNESIUM: Magnesium: 2 mg/dL (ref 1.7–2.4)

## 2021-10-30 MED ORDER — POTASSIUM CHLORIDE CRYS ER 20 MEQ PO TBCR
40.0000 meq | EXTENDED_RELEASE_TABLET | ORAL | Status: AC
  Administered 2021-10-30 (×2): 40 meq via ORAL
  Filled 2021-10-30 (×2): qty 2

## 2021-10-30 MED ORDER — OMEPRAZOLE 20 MG PO CPDR: 20.0000 mg | DELAYED_RELEASE_CAPSULE | Freq: Every day | ORAL | 3 refills | Status: AC

## 2021-10-30 MED ORDER — POTASSIUM CHLORIDE ER 20 MEQ PO TBCR: 20.0000 meq | EXTENDED_RELEASE_TABLET | Freq: Every day | ORAL | 3 refills | Status: AC

## 2021-10-30 MED ORDER — CALCIUM CARBONATE 1250 (500 CA) MG PO TABS: 1.0000 | ORAL_TABLET | Freq: Two times a day (BID) | ORAL | Status: DC

## 2021-10-30 MED ORDER — BISACODYL 10 MG RE SUPP
10.0000 mg | Freq: Once | RECTAL | Status: AC
  Administered 2021-10-30: 10 mg via RECTAL
  Filled 2021-10-30: qty 1

## 2021-10-30 MED ORDER — FERROUS SULFATE 325 (65 FE) MG PO TABS
325.0000 mg | ORAL_TABLET | Freq: Two times a day (BID) | ORAL | 3 refills | Status: AC
Start: 2021-10-30 — End: ?

## 2021-10-30 MED ORDER — ATORVASTATIN CALCIUM 40 MG PO TABS: 40.0000 mg | ORAL_TABLET | Freq: Every day | ORAL | 3 refills | Status: AC

## 2021-10-30 MED ORDER — CALCIUM CARBONATE 1250 (500 CA) MG PO TABS
1.0000 | ORAL_TABLET | Freq: Two times a day (BID) | ORAL | 3 refills | Status: AC
Start: 2021-10-30 — End: ?

## 2021-10-30 MED ORDER — LACTATED RINGERS IV SOLN
INTRAVENOUS | Status: DC
  Filled 2021-10-30 (×3): qty 1000

## 2021-10-30 MED ORDER — KCL-LACTATED RINGERS 20 MEQ/L IV SOLN
INTRAVENOUS | Status: DC
  Filled 2021-10-30 (×3): qty 1000

## 2021-10-30 MED ORDER — BENZTROPINE MESYLATE 1 MG PO TABS
2.0000 mg | ORAL_TABLET | Freq: Every day | ORAL | 3 refills | Status: AC
Start: 2021-10-30 — End: ?

## 2021-10-30 MED ORDER — DULERA 200-5 MCG/ACT IN AERO
2.0000 | INHALATION_SPRAY | Freq: Two times a day (BID) | RESPIRATORY_TRACT | 2 refills | Status: AC
Start: 2021-10-30 — End: ?

## 2021-10-30 MED ORDER — AMLODIPINE BESYLATE 10 MG PO TABS: 10.0000 mg | ORAL_TABLET | Freq: Every day | ORAL | 3 refills | Status: AC

## 2021-10-30 MED ORDER — METFORMIN HCL 500 MG PO TABS: 500.0000 mg | ORAL_TABLET | Freq: Two times a day (BID) | ORAL | 3 refills | Status: AC

## 2021-10-30 MED ORDER — LOSARTAN POTASSIUM-HCTZ 50-12.5 MG PO TABS: 1.0000 | ORAL_TABLET | Freq: Every day | ORAL | 3 refills | Status: AC

## 2021-10-30 MED ORDER — CALCIUM GLUCONATE-NACL 1-0.675 GM/50ML-% IV SOLN
1.0000 g | Freq: Once | INTRAVENOUS | Status: AC
  Administered 2021-10-30: 1000 mg via INTRAVENOUS
  Filled 2021-10-30: qty 50

## 2021-10-30 MED ORDER — ONDANSETRON HCL 4 MG PO TABS
4.0000 mg | ORAL_TABLET | Freq: Four times a day (QID) | ORAL | 0 refills | Status: AC | PRN
Start: 2021-10-30 — End: ?

## 2021-10-30 MED ORDER — LACTULOSE 10 GM/15ML PO SOLN
30.0000 g | Freq: Once | ORAL | Status: AC
Start: 2021-10-30 — End: 2021-10-30
  Administered 2021-10-30: 30 g via ORAL
  Filled 2021-10-30: qty 60

## 2021-10-30 MED ORDER — MULTI-VITAMIN/MINERALS PO TABS: 1.0000 | ORAL_TABLET | Freq: Every day | ORAL | 2 refills | Status: AC

## 2021-10-30 MED ORDER — ACETAMINOPHEN 325 MG PO TABS: 650.0000 mg | ORAL_TABLET | Freq: Four times a day (QID) | ORAL | 0 refills | Status: AC | PRN

## 2021-10-30 NOTE — Evaluation (Signed)
Physical Therapy Evaluation ?Patient Details ?Name: Terri Bauer ?MRN: [id number] ?DOB: [birth year] ?Today's Date: 10/30/2021 ? ?History of Present Illness ? 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  She reportedly was going to the bathroom and had complained of weakness in her legs and ended up falling down.  The group home was concerned that she may have had a UTI which has caused these falls and similar symptoms in the past.  Patient apparently was assisted to the floor by staff at the facility.  She had no head injuries and no loss of consciousness.  Of note, group home reports frequent falls.  Group home also reported patient had several loose stools and was incontinent of stool when she fell down which she has done in the past.  She had dark stools that tested guaiac negative but she also takes twice daily iron supplementation. ?  ?Clinical Impression ? Patient functioning at baseline for functional mobility and gait demonstrating good return for bed mobility, transfers and ambulation in room and hallway.  Plan:  Patient discharged from physical therapy to care of nursing for ambulation daily as tolerated for length of stay.  ?   ?   ? ?Recommendations for follow up therapy are one component of a multi-disciplinary discharge planning process, led by the attending physician.  Recommendations may be updated based on patient status, additional functional criteria and insurance authorization. ? ?Follow Up Recommendations No PT follow up ? ?  ?Assistance Recommended at Discharge Intermittent Supervision/Assistance  ?Patient can return home with the following ? Help with stairs or ramp for entrance;Assistance with  cooking/housework ? ?  ?Equipment Recommendations None recommended by PT  ?Recommendations for Other Services ?    ?  ?Functional Status Assessment Patient has not had a recent decline in their functional status  ? ?  ?Precautions / Restrictions Precautions ?Precautions: Fall ?Restrictions ?Weight Bearing Restrictions: No  ? ?  ? ?Mobility ? Bed Mobility ?Overal bed mobility: Modified Independent ?  ?  ?  ?  ?  ?  ?  ?  ? ?Transfers ?Overall transfer level: Modified independent ?  ?  ?  ?  ?  ?  ?  ?  ?  ?  ? ?Ambulation/Gait ?Ambulation/Gait assistance: Modified independent (Device/Increase time) ?Gait Distance (Feet): 80 Feet ?Assistive device: None ?Gait Pattern/deviations: WFL(Within Functional Limits) ?Gait velocity: near normal ?  ?  ?General Gait Details: grossly WFL with good return for ambulation in room and hallway without loss of balance ? ?Stairs ?  ?  ?  ?  ?  ? ?Wheelchair Mobility ?  ? ?Modified Rankin (Stroke Patients Only) ?  ? ?  ? ?Balance Overall balance assessment: No apparent balance deficits (not formally assessed) ?  ?  ?  ?  ?  ?  ?  ?  ?  ?  ?  ?  ?  ?  ?  ?  ?  ?  ?   ? ? ? ?Pertinent Vitals/Pain Pain Assessment ?Pain Assessment: No/denies pain  ? ? ?Home Living Family/patient expects to be discharged to:: Group home ?  ?  ?  ?  ?  ?  ?  ?  ?  ?   ?  ?Prior Function Prior Level of  Function : Needs assist ?  ?  ?  ?Physical Assist : Mobility (physical);ADLs (physical) ?Mobility (physical): Bed mobility;Transfers;Gait;Stairs ?  ?Mobility Comments: hosuehold ambulator without AD ?ADLs Comments: assisted by group home staff ?  ? ? ?Hand Dominance  ?   ? ?  ?Extremity/Trunk Assessment  ? Upper Extremity Assessment ?Upper Extremity Assessment: Overall WFL for tasks assessed ?  ? ?Lower Extremity Assessment ?Lower Extremity Assessment: Overall WFL for tasks assessed ?  ? ?Cervical / Trunk Assessment ?Cervical / Trunk Assessment: Normal  ?Communication  ? Communication: No difficulties  ?Cognition  Arousal/Alertness: Awake/alert ?Behavior During Therapy: Restless, Impulsive ?Overall Cognitive Status: History of cognitive impairments - at baseline ?  ?  ?  ?  ?  ?  ?  ?  ?  ?  ?  ?  ?  ?  ?  ?  ?  ?  ?  ? ?  ?General Comments   ? ?  ?Exercises    ? ?Assessment/Plan  ?  ?PT Assessment Patient does not need any further PT services  ?PT Problem List   ? ?   ?  ?PT Treatment Interventions     ? ?PT Goals (Current goals can be found in the Care Plan section)  ?Acute Rehab PT Goals ?Patient Stated Goal: return home ?PT Goal Formulation: With patient ?Time For Goal Achievement: 10/30/21 ?Potential to Achieve Goals: Good ? ?  ?Frequency   ?  ? ? ?Co-evaluation   ?  ?  ?  ?  ? ? ?  ?AM-PAC PT "6 Clicks" Mobility  ?Outcome Measure Help needed turning from your back to your side while in a flat bed without using bedrails?: None ?Help needed moving from lying on your back to sitting on the side of a flat bed without using bedrails?: None ?Help needed moving to and from a bed to a chair (including a wheelchair)?: None ?Help needed standing up from a chair using your arms (e.g., wheelchair or bedside chair)?: None ?Help needed to walk in hospital room?: None ?Help needed climbing 3-5 steps with a railing? : None ?6 Click Score: 24 ? ?  ?End of Session   ?Activity Tolerance: Patient tolerated treatment well ?Patient left: in chair;with chair alarm set;with call bell/phone within reach ?Nurse Communication: Mobility status ?PT Visit Diagnosis: Unsteadiness on feet (R26.81);Other abnormalities of gait and mobility (R26.89);Muscle weakness (generalized) (M62.81) ?  ? ?Time: 0998-3382 ?PT Time Calculation (min) (ACUTE ONLY): 16 min ? ? ?Charges:   PT Evaluation ?$PT Eval Low Complexity: 1 Low ?PT Treatments ?$Therapeutic Activity: 8-22 mins ?  ?   ? ? ?12:19 PM, 10/30/21 ?Ocie Bob, MPT ?Physical Therapist with [place] [facility] ?[phone] office ?1937 mobile phone ? ? ?

## 2021-10-30 NOTE — Progress Notes (Signed)
Nsg Discharge Note ? ?Admit Date:  10/28/2021 ?Discharge date: 10/30/2021 ?  ?Terri Bauer to be D/C'd Home per MD order.  AVS completed.  Copy for chart, and copy for patient signed, and dated. ?Patient/caregiver able to verbalize understanding. ? ?Discharge Medication: ?Allergies as of 10/30/2021   ?No Known Allergies ?  ? ?  ?Medication List  ?  ? ?STOP taking these medications   ? ?potassium chloride SA 20 MEQ tablet ?Commonly known as: KLOR-CON M ?  ? ?  ? ?TAKE these medications   ? ?acetaminophen 325 MG tablet ?Commonly known as: TYLENOL ?Take 2 tablets (650 mg total) by mouth every 6 (six) hours as needed for mild pain, fever or headache (or Fever >/= 101). ?  ?amLODipine 10 MG tablet ?Commonly known as: NORVASC ?Take 1 tablet (10 mg total) by mouth daily. (0800) ?  ?atorvastatin 40 MG tablet ?Commonly known as: LIPITOR ?Take 1 tablet (40 mg total) by mouth daily. ?  ?benztropine 1 MG tablet ?Commonly known as: COGENTIN ?Take 2 tablets (2 mg total) by mouth at bedtime. ?  ?calcium carbonate 1250 (500 Ca) MG tablet ?Commonly known as: OS-CAL - dosed in mg of elemental calcium ?Take 1 tablet (500 mg of elemental calcium total) by mouth 2 (two) times daily with a meal. ?  ?Dulera 200-5 MCG/ACT Aero ?Generic drug: mometasone-formoterol ?Inhale 2 puffs into the lungs in the morning and at bedtime. (0800 & 2000) ?  ?ferrous sulfate 325 (65 FE) MG tablet ?Take 1 tablet (325 mg total) by mouth 2 (two) times daily with a meal. (0800 & 2000) ?What changed: when to take this ?  ?fluticasone 50 MCG/ACT nasal spray ?Commonly known as: FLONASE ?Place 1 spray into both nostrils daily. (0800) ?  ?ketoconazole 2 % cream ?Commonly known as: NIZORAL ?Apply 1 application topically 2 (two) times daily. (0800 & 2000) ?  ?losartan-hydrochlorothiazide 50-12.5 MG tablet ?Commonly known as: HYZAAR ?Take 1 tablet by mouth daily. (0800) ?  ?metFORMIN 500 MG tablet ?Commonly known as: GLUCOPHAGE ?Take 1 tablet (500 mg total) by mouth  2 (two) times daily with a meal. (0800 & 1700) ?  ?multivitamin with minerals tablet ?Take 1 tablet by mouth daily. ?  ?OLANZapine 10 MG tablet ?Commonly known as: ZYPREXA ?Take 10 mg by mouth at bedtime. ?What changed: Another medication with the same name was removed. Continue taking this medication, and follow the directions you see here. ?  ?omeprazole 20 MG capsule ?Commonly known as: PRILOSEC ?Take 1 capsule (20 mg total) by mouth daily. (0800) ?What changed: See the new instructions. ?  ?ondansetron 4 MG tablet ?Commonly known as: ZOFRAN ?Take 1 tablet (4 mg total) by mouth every 6 (six) hours as needed for nausea. ?  ?Potassium Chloride ER 20 MEQ Tbcr ?Take 20 mEq by mouth daily. 1 tab daily by mouth--take daily while taking HCTZ/hydrochlorothiazide ?  ? ?  ? ? ?Discharge Assessment: ?Vitals:  ? 10/30/21 0420 10/30/21 0807  ?BP: 114/75   ?Pulse: (!) 105   ?Resp: 19   ?Temp: 98.4 ?F (36.9 ?C)   ?SpO2: 94% 92%  ? Skin clean, dry and intact without evidence of skin break down, no evidence of skin tears noted. ?IV catheter discontinued intact. Site without signs and symptoms of complications - no redness or edema noted at insertion site, patient denies c/o pain - only slight tenderness at site.  Dressing with slight pressure applied. ? ?D/c Instructions-Education: ?Discharge instructions given to patient/family with verbalized understanding. ?D/c education completed  with patient/family including follow up instructions, medication list, d/c activities limitations if indicated, with other d/c instructions as indicated by MD - patient able to verbalize understanding, all questions fully answered. ?Patient instructed to return to ED, call 911, or call MD for any changes in condition.  ?Patient escorted via WC, and D/C home via private auto. ? ?Demetrio Lapping, LPN ?0/04/2724 3:66 PM  ?

## 2021-10-30 NOTE — [place] FL2 (Signed)
? MEDICAID FL2 LEVEL OF CARE SCREENING TOOL  ?  ? ?IDENTIFICATION  ?Patient Name: ?Terri Bauer Birthdate: Jan 22, 1966 Sex: female Admission Date (Current Location): ?10/28/2021  ?[place] Number: ? [place] ?  Facility and Address:  ?[facility],  [address], [place] [address] ?     Provider Number: ?[id number]  ?Attending Physician Name and Address:  ?Shon Hale, MD ? Relative Name and Phone Number:  ?  ?   ?Current Level of Care: ?Hospital Recommended Level of Care: ?Family Care Home Prior Approval Number: ?  ? ?Date Approved/Denied: ?  PASRR Number: ?  ? ?Discharge Plan: ?Domiciliary (Rest home) ?  ? ?Current Diagnoses: ?Patient Active Problem List  ? Diagnosis Date Noted  ? Hypocalcemia 10/28/2021  ? Hypokalemia 10/28/2021  ? Hypomagnesemia 10/28/2021  ? Intellectual disability 10/28/2021  ? AKI (acute kidney injury) (HCC) 10/28/2021  ? Severe dehydration 10/28/2021  ? Diuretic-induced hypokalemia 10/28/2021  ? Fall at home 10/28/2021  ? Fecal incontinence 10/28/2021  ? Essential hypertension 10/28/2021  ? Type 2 diabetes mellitus (HCC) 10/28/2021  ? Sclerosing adenosis of left breast   ? Abnormal mammogram of left breast 09/21/2019  ? Screening for colorectal cancer 09/21/2019  ? Encounter for gynecological examination with Papanicolaou smear of cervix 09/21/2019  ? Encounter for screening colonoscopy 09/21/2015  ? FH: colon cancer 09/21/2015  ? ? ?Orientation RESPIRATION BLADDER Height & Weight   ?  ?Self, Time, Situation, Place ? Normal Continent Weight: 245 lb (111.1 kg) ?Height:  5\' 1"  (154.9 cm)  ?BEHAVIORAL SYMPTOMS/MOOD NEUROLOGICAL BOWEL NUTRITION STATUS  ?    Continent Diet (Regular no concentrated sweets)  ?AMBULATORY STATUS COMMUNICATION OF NEEDS Skin   ?Limited Assist Verbally Normal ?  ?  ?  ?    ?     ?     ? ? ?Personal Care Assistance Level of Assistance  ?Bathing, Feeding, Dressing Bathing Assistance: Limited assistance ?Feeding assistance:  Independent ?Dressing Assistance: Limited assistance ?   ? ?Functional Limitations Info  ?Sight, Hearing, Speech Sight Info: Adequate ?Hearing Info: Adequate ?Speech Info: Adequate  ? ? ?SPECIAL CARE FACTORS FREQUENCY  ?    ?  ?  ?  ?  ?  ?  ?   ? ? ?Contractures Contractures Info: Not present  ? ? ?Additional Factors Info  ?Code Status, Allergies Code Status Info: Full ?Allergies Info: NKA ?  ?  ?  ?   ? ?Current Medications (10/30/2021):  This is the current hospital active medication list ?Current Facility-Administered Medications  ?Medication Dose Route Frequency Provider Last Rate Last Admin  ? acetaminophen (TYLENOL) tablet 650 mg  650 mg Oral Q6H PRN Johnson, Clanford L, MD      ? Or  ? acetaminophen (TYLENOL) suppository 650 mg  650 mg Rectal Q6H PRN Johnson, Clanford L, MD      ? albuterol (PROVENTIL) (2.5 MG/3ML) 0.083% nebulizer solution 2.5 mg  2.5 mg Nebulization Q2H PRN Johnson, Clanford L, MD      ? amLODipine (NORVASC) tablet 5 mg  5 mg Oral Daily Johnson, Clanford L, MD   5 mg at 10/30/21 0806  ? atorvastatin (LIPITOR) tablet 40 mg  40 mg Oral QPM Johnson, Clanford L, MD   40 mg at 10/29/21 1836  ? benztropine (COGENTIN) tablet 2 mg  2 mg Oral QHS Johnson, Clanford L, MD   2 mg at 10/29/21 2158  ? bisacodyl (DULCOLAX) EC tablet 5 mg  5 mg Oral Daily PRN 2159,  Clanford L, MD      ? calcium gluconate 1 g/ 50 mL sodium chloride IVPB  1 g Intravenous Once Emokpae, Courage, MD 50 mL/hr at 10/30/21 1130 1,000 mg at 10/30/21 1130  ? enoxaparin (LOVENOX) injection 50 mg  50 mg Subcutaneous Q24H Johnson, Clanford L, MD   50 mg at 10/29/21 2159  ? ferrous sulfate tablet 325 mg  325 mg Oral Q breakfast Laural Benes, Clanford L, MD   325 mg at 10/30/21 0758  ? fluticasone (FLONASE) 50 MCG/ACT nasal spray 1 spray  1 spray Each Nare Daily Laural Benes, Clanford L, MD   1 spray at 10/30/21 0938  ? hydrALAZINE (APRESOLINE) injection 5 mg  5 mg Intravenous Q4H PRN Johnson, Clanford L, MD      ? insulin aspart (novoLOG)  injection 0-15 Units  0-15 Units Subcutaneous TID WC Laural Benes, Clanford L, MD   2 Units at 10/30/21 0759  ? insulin aspart (novoLOG) injection 2 Units  2 Units Subcutaneous TID WC Johnson, Clanford L, MD   2 Units at 10/30/21 0759  ? ketoconazole (NIZORAL) 2 % cream 1 application.  1 application. Topical BID Laural Benes, Clanford L, MD   1 application. at 10/30/21 0809  ? lactated ringers 1,000 mL with potassium chloride 20 mEq infusion   Intravenous Continuous Shon Hale, MD 75 mL/hr at 10/30/21 0932 New Bag at 10/30/21 0932  ? mometasone-formoterol (DULERA) 200-5 MCG/ACT inhaler 2 puff  2 puff Inhalation BID Standley Dakins L, MD   2 puff at 10/30/21 0806  ? OLANZapine (ZYPREXA) tablet 10 mg  10 mg Oral QHS Johnson, Clanford L, MD   10 mg at 10/29/21 2158  ? ondansetron (ZOFRAN) tablet 4 mg  4 mg Oral Q6H PRN Johnson, Clanford L, MD      ? Or  ? ondansetron (ZOFRAN) injection 4 mg  4 mg Intravenous Q6H PRN Johnson, Clanford L, MD      ? oxyCODONE (Oxy IR/ROXICODONE) immediate release tablet 2.5 mg  2.5 mg Oral Q4H PRN Johnson, Clanford L, MD      ? pantoprazole (PROTONIX) EC tablet 40 mg  40 mg Oral Daily Johnson, Clanford L, MD   40 mg at 10/30/21 1829  ? ? ? ?Discharge Medications: ?acetaminophen 325 MG tablet ?Commonly known as: TYLENOL ?Take 2 tablets (650 mg total) by mouth every 6 (six) hours as needed for mild pain, fever or headache (or Fever >/= 101). ?   ?amLODipine 10 MG tablet ?Commonly known as: NORVASC ?Take 1 tablet (10 mg total) by mouth daily. (0800) ?   ?atorvastatin 40 MG tablet ?Commonly known as: LIPITOR ?Take 1 tablet (40 mg total) by mouth daily. ?   ?benztropine 1 MG tablet ?Commonly known as: COGENTIN ?Take 2 tablets (2 mg total) by mouth at bedtime. ?   ?calcium carbonate 1250 (500 Ca) MG tablet ?Commonly known as: OS-CAL - dosed in mg of elemental calcium ?Take 1 tablet (500 mg of elemental calcium total) by mouth 2 (two) times daily with a meal. ?   ?Dulera 200-5 MCG/ACT  Aero ?Generic drug: mometasone-formoterol ?Inhale 2 puffs into the lungs in the morning and at bedtime. (0800 & 2000) ?   ?ferrous sulfate 325 (65 FE) MG tablet ?Take 1 tablet (325 mg total) by mouth 2 (two) times daily with a meal. (0800 & 2000) ?What changed: when to take this ?   ?fluticasone 50 MCG/ACT nasal spray ?Commonly known as: FLONASE ?Place 1 spray into both nostrils daily. (0800) ?   ?ketoconazole  2 % cream ?Commonly known as: NIZORAL ?Apply 1 application topically 2 (two) times daily. (0800 & 2000) ?   ?losartan-hydrochlorothiazide 50-12.5 MG tablet ?Commonly known as: HYZAAR ?Take 1 tablet by mouth daily. (0800) ?   ?metFORMIN 500 MG tablet ?Commonly known as: GLUCOPHAGE ?Take 1 tablet (500 mg total) by mouth 2 (two) times daily with a meal. (0800 & 1700) ?   ?multivitamin with minerals tablet ?Take 1 tablet by mouth daily. ?   ?OLANZapine 10 MG tablet ?Commonly known as: ZYPREXA ?Take 10 mg by mouth at bedtime. ?What changed: Another medication with the same name was removed. Continue taking this medication, and follow the directions you see here. ?   ?omeprazole 20 MG capsule ?Commonly known as: PRILOSEC ?Take 1 capsule (20 mg total) by mouth daily. (0800) ?What changed: See the new instructions. ?   ?ondansetron 4 MG tablet ?Commonly known as: ZOFRAN ?Take 1 tablet (4 mg total) by mouth every 6 (six) hours as needed for nausea. ?   ?Potassium Chloride ER 20 MEQ Tbcr ?Take 20 mEq by mouth daily. 1 tab daily by mouth--take daily while taking HCTZ/hydrochlorothiazide ?   ? ? ?Relevant Imaging Results: ? ?Relevant Lab Results: ? ? ?Additional Information ?SSN: [id number] ? ?Villa HerbSamantha  [place], LCSWA ? ? ? ? ?

## 2021-10-30 NOTE — Discharge Summary (Addendum)
?                                                                                ? ? ?Terri Bauer, is a 56 y.o. female  DOB 12-09-1965  MRN [id number]. ? ?Admission date:  10/28/2021  Admitting Physician  Cleora Fleet, MD ? ?Discharge Date:  10/30/2021  ? ?Primary MD  Benetta Spar, MD ? ?Recommendations for primary care physician for things to follow:  ? ?1)Repeat CMP Blood test including Calcium around Monday 11/04/21 ?2)Avoid ibuprofen/Advil/Aleve/Motrin/Goody Powders/Naproxen/BC powders/Meloxicam/Diclofenac/Indomethacin and other Nonsteroidal anti-inflammatory medications as these will make you more likely to bleed and can cause stomach ulcers, can also cause Kidney problems.  ?3)Repeat CBC Blood test around Monday 11/04/21 ? ?Admission Diagnosis  Hypocalcemia [E83.51] ?Hypokalemia [E87.6] ? ? ?Discharge Diagnosis  Hypocalcemia [E83.51] ?Hypokalemia [E87.6]   ? ?Principal Problem: ?  Hypocalcemia ?Active Problems: ?  Hypokalemia ?  Hypomagnesemia ?  Intellectual disability ?  AKI (acute kidney injury) (HCC) ?  Severe dehydration ?  Diuretic-induced hypokalemia ?  Fall at home ?  Fecal incontinence ?  Essential hypertension ?  Type 2 diabetes mellitus (HCC) ?    ? ?Past Medical History:  ?Diagnosis Date  ? Bronchitis   ? Diabetes (HCC)   ? Hypertension   ? Intellectual delay   ? Other intellectual disabilities   ? sees psychiatrist  ? Poor historian   ? limited due to intellectual disabilities.  ? ? ?Past Surgical History:  ?Procedure Laterality Date  ? BREAST BIOPSY Left 11/15/2019  ? Procedure: BREAST BIOPSY WITH NEEDLE LOCALIZATION;  Surgeon: Franky Macho, MD;  Location: [facility];  Service: General;  Laterality: Left;  ? COLONOSCOPY WITH PROPOFOL N/A 10/09/2015  ? Procedure: COLONOSCOPY WITH PROPOFOL;  Surgeon: West Bali, MD;  Location: [facility];  Service: Endoscopy;  Laterality: N/A;  1100-moved to 845 per Ginger  ? none    ? ? ? ? ? HPI  from the history and physical done on the day  of admission:  ? ?Chief Complaint: Fall  ?  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  She reportedly was going to the bathroom and had complained of weakness in her legs and ended up falling down.  The group home was concerned that she may have had a UTI which has caused these falls and similar symptoms in the past.  Patient apparently was assisted to the floor by staff at the facility.  She had no head injuries and no loss of consciousness.  Of note, group home reports frequent falls.  Group home also reported patient had several loose stools and was incontinent of stool when she fell down which she has done in the past.  She had dark stools that tested guaiac negative but she also takes twice daily iron supplementation. ?  ?Her work-up in the emergency department revealed guaiac negative stool, EKG unremarkable, TSH within normal limits, hemoglobin 11.5,  WBC 7.1, potassium 2.2, glucose 112, creatinine 1.72, calcium 5.5, albumin 3.7, magnesium 0.7.  Her anion gap was 16.  Patient was given supplemental potassium and magnesium and hospitalization was requested for further management. ? ? ? Hospital Course:  ? ?  ?56 year old female with known intellectual disability living at a group home, hypertension, type 2 diabetes mellitus, anemia, bronchitis, behavioral disturbances managed by psychiatrist, history of left breast biopsy and colonoscopy in 2017 presented to the emergency department after fall this morning at her group home.  She reportedly was going to the bathroom and had complained of weakness in her legs and ended up falling down.  The group home was concerned that she may have had a UTI which has caused  these falls and similar symptoms in the past.  Patient apparently was assisted to the floor by staff at the facility.  She had no head injuries and no loss of consciousness.  Of note, group home reports frequent falls.  Group home also reported patient had several loose stools and was incontinent of stool when she fell down which she has done in the past.  She had dark stools that tested guaiac negative but she also takes twice daily iron supplementation. ? ?Her work-up in the emergency department revealed guaiac negative stool, EKG unremarkable, TSH within normal limits, hemoglobin 11.5, WBC 7.1, potassium 2.2, glucose 112, creatinine 1.72, calcium 5.5, albumin 3.7, magnesium 0.7.  Her anion gap was 16.  Patient was given supplemental potassium and magnesium and hospitalization was requested for further management. ? ?10/29/2021:  calcium remains very low, magnesium repleted, potassium remains very low, patient slowly starting to feel better and mentation slowly improving to baseline.  Continue current treatments. Trace amt of vaginal bleeding reported.  Instructed RN to monitor and record if any further bleeding.  ? ? ?Assessment and Plan: ?1)Hypocalcemia ?--- Serum albumin is low, when corrected for low albumin calcium is about 6.8 ?-HCTZ should help hypocalcemia, so continueHCTZ ?-Patient received additional IV replacements of calcium ?-EKG sinus with prolonged QT ?-Discharged on oral calcium replacement ?-Repeat CMP with calcium on Monday, 11/04/2021 ? ?2)Hypokalemia/Hypomagnesemia/Dehydration ?-Replaced, okay to continue HCTZ as above #1 ?-Discharged on potassium replacements ?-Repeat CMP on Monday, 11/05/2018 ? ?3)Type 2 diabetes mellitus (HCC) ?A1c 5.8 reflecting excellent diabetic control PTA ?-Restart PTA metformin 500 twice daily ? ?4)HTN- ?-Stable, okay to restart PTA amlodipine and losartan HCTZ ? ?5)Fecal incontinence/Generalized Weakness and H/o Falls--- ?-- Caretakers report that this usually occurs  with episodes of falling.  -No concerns for seizures ?-Evaluated by physical therapist, as per PT no further needs ? ?6)AKI-acute kidney injury ?Renal function is improved ?Creatinine is down to 1.44 from 1.72 ?-Repeat CMP around Monday, 11/04/2021 ? ?7)Intellectual disability ?-- Patient poor historian but is able to vocalize her needs ?-Patient is calm and cooperative ? ?8) acute on chronic anemia--suspect some hemodilution effect ?-Repeat CBC around Monday, 11/04/2021 ? ?9)Morbid Obesity- ?-Low calorie diet, portion control and increase physical activity discussed with patient ?-Body mass index is 46.29 kg/m?. ? ? ?Discharge Condition: stable ? ?Follow UP ? ? Follow-up Information   ? ? Benetta Spar, MD. Schedule an appointment as soon as possible for a visit on 11/04/2021.   ?Specialty: Internal Medicine ?Why: Repeat CBC and CMP on Monday 11/04/21 ?Contact information: ?[address] ?[place] [address] ?[phone] ? ? ?  ?  ? ?  ?  ? ?  ?  ?Diet and Activity recommendation:  As advised ? ?Discharge Instructions   ? ?  Discharge Instructions   ? ? Call MD for:  difficulty breathing, headache or visual disturbances   Complete by: As directed ?  ? Call MD for:  persistant dizziness or light-headedness   Complete by: As directed ?  ? Call MD for:  persistant nausea and vomiting   Complete by: As directed ?  ? Call MD for:  temperature >100.4   Complete by: As directed ?  ? Diet - low sodium heart healthy   Complete by: As directed ?  ? Discharge instructions   Complete by: As directed ?  ? 1)Repeat CMP Blood test including Calcium around Monday 11/04/21 ?2)Avoid ibuprofen/Advil/Aleve/Motrin/Goody Powders/Naproxen/BC powders/Meloxicam/Diclofenac/Indomethacin and other Nonsteroidal anti-inflammatory medications as these will make you more likely to bleed and can cause stomach ulcers, can also cause Kidney problems.  ?3)Repeat CBC Blood test around Monday 11/04/21  ? Increase activity slowly    Complete by: As directed ?  ? ?  ? ? ? ? Discharge Medications  ? ?  ?Allergies as of 10/30/2021   ?No Known Allergies ?  ? ?  ?Medication List  ?  ? ?STOP taking these medications   ? ?potassium chloride SA

## 2021-10-30 NOTE — TOC Transition Note (Signed)
Transition of Care (TOC) - CM/SW Discharge Note ? ? ?Patient Details  ?Name: Terri Bauer ?MRN: [id number] ?Date of Birth: 12/24/1965 ? ?Transition of Care (TOC) CM/SW Contact:  ?Villa Herb, LCSWA ?Phone Number: ?10/30/2021, 12:40 PM ? ? ?Clinical Narrative:    ?CSW updated Willaim Rayas with pts family care home that pt is ready for DC today. Pts aunt updated also and will provide transportation. Ms. Thomas requested that fl2 and dc summary be printed and provided to pt/aunt and they will give to Ms. Thomas. TOC signing off.  ? ?Final next level of care: Assisted Living ?Barriers to Discharge: No Barriers Identified ? ? ?Patient Goals and CMS Choice ?Patient states their goals for this hospitalization and ongoing recovery are:: go home ?CMS Medicare.gov Compare Post Acute Care list provided to:: Patient ?Choice offered to / list presented to : Patient ? ?Discharge Placement ?  ?           ?  ?Patient to be transferred to facility by: family ?Name of family member notified: cannon,hazel Celine Ahr)   [phone] ?Patient and family notified of of transfer: 10/30/21 ? ?Discharge Plan and Services ?In-house Referral: Clinical Social Work ?  ?           ?  ?  ?  ?  ?  ?  ?  ?  ?  ?  ? ?Social Determinants of Health (SDOH) Interventions ?  ? ? ?Readmission Risk Interventions ?   ? View : No data to display.  ?  ?  ?  ? ? ? ? ? ?

## 2021-10-30 NOTE — Discharge Instructions (Signed)
1)Repeat CMP Blood test including Calcium around Monday 11/04/21 ?2)Avoid ibuprofen/Advil/Aleve/Motrin/Goody Powders/Naproxen/BC powders/Meloxicam/Diclofenac/Indomethacin and other Nonsteroidal anti-inflammatory medications as these will make you more likely to bleed and can cause stomach ulcers, can also cause Kidney problems.  ?3)Repeat CBC Blood test around Monday 11/04/21 ?

## 2021-10-30 NOTE — Progress Notes (Signed)
Lab called with critical lab value Ca 6.0. Night MD made aware of lab value.  ?

## 2021-11-26 ENCOUNTER — Other Ambulatory Visit (HOSPITAL_COMMUNITY): Payer: Self-pay | Admitting: Internal Medicine

## 2021-11-26 DIAGNOSIS — Z1231 Encounter for screening mammogram for malignant neoplasm of breast: Secondary | ICD-10-CM

## 2021-12-02 ENCOUNTER — Ambulatory Visit (HOSPITAL_COMMUNITY)
Admission: RE | Admit: 2021-12-02 | Discharge: 2021-12-02 | Disposition: A | Discharge status: Home / Self Care | Payer: Medicaid Other | Source: Ambulatory Visit | Attending: Internal Medicine | Admitting: Internal Medicine

## 2021-12-02 DIAGNOSIS — Z1231 Encounter for screening mammogram for malignant neoplasm of breast: Secondary | ICD-10-CM | POA: Insufficient documentation

## 2022-04-28 ENCOUNTER — Encounter (HOSPITAL_COMMUNITY): Payer: Self-pay | Admitting: *Deleted

## 2022-04-28 ENCOUNTER — Emergency Department (HOSPITAL_COMMUNITY): Payer: Medicaid Other

## 2022-04-28 ENCOUNTER — Other Ambulatory Visit: Payer: Self-pay

## 2022-04-28 ENCOUNTER — Emergency Department (HOSPITAL_COMMUNITY)
Admission: EM | Admit: 2022-04-28 | Discharge: 2022-04-28 | Disposition: A | Discharge status: Home / Self Care | Payer: Medicaid Other | Attending: Emergency Medicine | Admitting: Emergency Medicine

## 2022-04-28 DIAGNOSIS — M109 Gout, unspecified: Secondary | ICD-10-CM | POA: Insufficient documentation

## 2022-04-28 DIAGNOSIS — M79671 Pain in right foot: Secondary | ICD-10-CM | POA: Diagnosis present

## 2022-04-28 LAB — CBC WITH DIFFERENTIAL/PLATELET
Abs Immature Granulocytes: 0.04 10*3/uL (ref 0.00–0.07)
Basophils Absolute: 0 10*3/uL (ref 0.0–0.1)
Basophils Relative: 0 %
Eosinophils Absolute: 0.2 10*3/uL (ref 0.0–0.5)
Eosinophils Relative: 3 %
HCT: 34.8 % — ABNORMAL LOW (ref 36.0–46.0)
Hemoglobin: 11.5 g/dL — ABNORMAL LOW (ref 12.0–15.0)
Immature Granulocytes: 1 %
Lymphocytes Relative: 25 %
Lymphs Abs: 2.1 10*3/uL (ref 0.7–4.0)
MCH: 32.3 pg (ref 26.0–34.0)
MCHC: 33 g/dL (ref 30.0–36.0)
MCV: 97.8 fL (ref 80.0–100.0)
Monocytes Absolute: 0.4 10*3/uL (ref 0.1–1.0)
Monocytes Relative: 5 %
Neutro Abs: 5.7 10*3/uL (ref 1.7–7.7)
Neutrophils Relative %: 66 %
Platelets: 275 10*3/uL (ref 150–400)
RBC: 3.56 MIL/uL — ABNORMAL LOW (ref 3.87–5.11)
RDW: 13.9 % (ref 11.5–15.5)
WBC: 8.5 10*3/uL (ref 4.0–10.5)
nRBC: 0 % (ref 0.0–0.2)

## 2022-04-28 LAB — BASIC METABOLIC PANEL
Anion gap: 10 (ref 5–15)
BUN: 16 mg/dL (ref 6–20)
CO2: 30 mmol/L (ref 22–32)
Calcium: 10 mg/dL (ref 8.9–10.3)
Chloride: 98 mmol/L (ref 98–111)
Creatinine, Ser: 1.03 mg/dL — ABNORMAL HIGH (ref 0.44–1.00)
GFR, Estimated: >60 mL/min (ref >60)
Glucose, Bld: 117 mg/dL — ABNORMAL HIGH (ref 70–99)
Potassium: 3.6 mmol/L (ref 3.5–5.1)
Sodium: 138 mmol/L (ref 135–145)

## 2022-04-28 LAB — URIC ACID: Uric Acid, Serum: 7.3 mg/dL — ABNORMAL HIGH (ref 2.5–7.1)

## 2022-04-28 MED ORDER — PREDNISONE 20 MG PO TABS
40.0000 mg | ORAL_TABLET | Freq: Once | ORAL | Status: AC
  Administered 2022-04-28: 40 mg via ORAL
  Filled 2022-04-28: qty 2

## 2022-04-28 MED ORDER — PREDNISONE 20 MG PO TABS: 40.0000 mg | ORAL_TABLET | Freq: Every day | ORAL | 0 refills | Status: AC

## 2022-04-28 NOTE — ED Provider Notes (Signed)
[facility] EMERGENCY DEPARTMENT Provider Note   CSN: [id number] Arrival date & time: 04/28/22  3086     History {Add pertinent medical, surgical, social history, OB history to HPI:1} Chief Complaint  Patient presents with   Leg Swelling    Terri Bauer is a 56 y.o. female.  HPI     Terri Bauer is a 56 y.o. female who presents to the Emergency Department complaining of   Home Medications Prior to Admission medications   Medication Sig Start Date End Date Taking? Authorizing Provider  acetaminophen (TYLENOL) 325 MG tablet Take 2 tablets (650 mg total) by mouth every 6 (six) hours as needed for mild pain, fever or headache (or Fever >/= 101). 10/30/21   Roxan Hockey, MD  amLODipine (NORVASC) 10 MG tablet Take 1 tablet (10 mg total) by mouth daily. (0800) 10/30/21   Emokpae, Courage, MD  atorvastatin (LIPITOR) 40 MG tablet Take 1 tablet (40 mg total) by mouth daily. 10/30/21   Roxan Hockey, MD  benztropine (COGENTIN) 1 MG tablet Take 2 tablets (2 mg total) by mouth at bedtime. 10/30/21   Roxan Hockey, MD  calcium carbonate (OS-CAL - DOSED IN MG OF ELEMENTAL CALCIUM) 1250 (500 Ca) MG tablet Take 1 tablet (500 mg of elemental calcium total) by mouth 2 (two) times daily with a meal. 10/30/21   Emokpae, Courage, MD  DULERA 200-5 MCG/ACT AERO Inhale 2 puffs into the lungs in the morning and at bedtime. (0800 & 2000) 10/30/21   Emokpae, Courage, MD  ferrous sulfate 325 (65 FE) MG tablet Take 1 tablet (325 mg total) by mouth 2 (two) times daily with a meal. (0800 & 2000) 10/30/21   Emokpae, Courage, MD  fluticasone (FLONASE) 50 MCG/ACT nasal spray Place 1 spray into both nostrils daily. (0800) 06/23/19   [provider]  ketoconazole (NIZORAL) 2 % cream Apply 1 application topically 2 (two) times daily. (0800 & 2000) 08/30/19   [provider]  losartan-hydrochlorothiazide (HYZAAR) 50-12.5 MG tablet Take 1 tablet by mouth daily. (0800) 10/30/21   Denton Brick,  Courage, MD  metFORMIN (GLUCOPHAGE) 500 MG tablet Take 1 tablet (500 mg total) by mouth 2 (two) times daily with a meal. (0800 & 1700) 10/30/21   Emokpae, Courage, MD  Multiple Vitamins-Minerals (MULTIVITAMIN WITH MINERALS) tablet Take 1 tablet by mouth daily. 10/30/21 10/30/22  Roxan Hockey, MD  OLANZapine (ZYPREXA) 10 MG tablet Take 10 mg by mouth at bedtime. 10/18/21   [provider]  omeprazole (PRILOSEC) 20 MG capsule Take 1 capsule (20 mg total) by mouth daily. (0800) 10/30/21   Emokpae, Courage, MD  ondansetron (ZOFRAN) 4 MG tablet Take 1 tablet (4 mg total) by mouth every 6 (six) hours as needed for nausea. 10/30/21   Roxan Hockey, MD  Potassium Chloride ER 20 MEQ TBCR Take 20 mEq by mouth daily. 1 tab daily by mouth--take daily while taking HCTZ/hydrochlorothiazide 10/30/21   Roxan Hockey, MD      Allergies    Patient has no known allergies.    Review of Systems   Review of Systems  Physical Exam Updated Vital Signs BP (!) 160/83   Pulse 89   Temp 98 F (36.7 C) (Oral)   Resp 18   Ht 5\' 1"  (1.549 m)   Wt 108 kg   SpO2 100%   BMI 44.97 kg/m  Physical Exam  ED Results / Procedures / Treatments   Labs (all labs ordered are listed, but only abnormal results are displayed) Labs Reviewed - No  data to display  EKG None  Radiology No results found.  Procedures Procedures  {Document cardiac monitor, telemetry assessment procedure when appropriate:1}  Medications Ordered in ED Medications - No data to display  ED Course/ Medical Decision Making/ A&P                           Medical Decision Making  ***  {Document critical care time when appropriate:1} {Document review of labs and clinical decision tools ie heart score, Chads2Vasc2 etc:1}  {Document your independent review of radiology images, and any outside records:1} {Document your discussion with family members, caretakers, and with consultants:1} {Document social determinants of health  affecting pt's care:1} {Document your decision making why or why not admission, treatments were needed:1} Final Clinical Impression(s) / ED Diagnoses Final diagnoses:  None    Rx / DC Orders ED Discharge Orders     None

## 2022-04-28 NOTE — Discharge Instructions (Signed)
Your toe pain is likely secondary to gout.  You have been given medication for your symptoms.  Please start the prednisone tomorrow.  Elevate your foot when possible.  Try to avoid foods that may aggravate this.  Follow-up with your primary care provider for recheck.

## 2022-04-28 NOTE — ED Triage Notes (Signed)
Pt states she noticed she had swelling to her right foot x 2 days ago; pt states the pain is worse when she applies weight

## 2022-10-28 ENCOUNTER — Encounter: Payer: Self-pay | Admitting: Adult Health

## 2022-10-28 ENCOUNTER — Ambulatory Visit (INDEPENDENT_AMBULATORY_CARE_PROVIDER_SITE_OTHER): Payer: Medicaid Other | Admitting: Adult Health

## 2022-10-28 ENCOUNTER — Other Ambulatory Visit (HOSPITAL_COMMUNITY)
Admission: RE | Admit: 2022-10-28 | Discharge: 2022-10-28 | Disposition: A | Discharge status: Home / Self Care | Payer: Medicaid Other | Source: Ambulatory Visit | Attending: Adult Health | Admitting: Adult Health

## 2022-10-28 VITALS — BP 136/68 | HR 101 | Ht 61.0 in | Wt 248.0 lb

## 2022-10-28 DIAGNOSIS — Z1211 Encounter for screening for malignant neoplasm of colon: Secondary | ICD-10-CM | POA: Diagnosis not present

## 2022-10-28 DIAGNOSIS — Z0000 Encounter for general adult medical examination without abnormal findings: Secondary | ICD-10-CM | POA: Insufficient documentation

## 2022-10-28 LAB — HEMOCCULT GUIAC POC 1CARD (OFFICE): Fecal Occult Blood, POC: NEGATIVE

## 2022-10-28 NOTE — Progress Notes (Signed)
Patient ID: Terri Bauer, female   DOB: [birth year], 57 y.o.   MRN: [id number] History of Present Illness: Terri Bauer is a 57 year old black female,single, PM, in for a well woman gyn exam and pap. She resides at [facility].  PCP is Dr Felecia Shelling    Current Medications, Allergies, Past Medical History, Past Surgical History, Family History and Social History were reviewed in [facility] electronic medical record.     Review of Systems: Patient denies any headaches, hearing loss, fatigue, blurred vision, shortness of breath, chest pain, abdominal pain, problems with bowel movements, urination, or intercourse(not having sex). No joint pain or mood swings.  Denies any vaginal bleeding   Physical Exam:BP 136/68 (BP Location: Left Arm, Patient Position: Sitting, Cuff Size: Large)   Pulse (!) 101   Ht  (1.549 m)   Wt 248 lb (112.5 kg)   BMI 46.86 kg/m   General:  Well developed, well nourished, no acute distress Skin:  Warm and dry Neck:  Midline trachea, normal thyroid, good ROM, no lymphadenopathy Lungs; Clear to auscultation bilaterally Breast:  No dominant palpable mass, retraction, or nipple discharge Cardiovascular: Regular rate and rhythm Abdomen:  Soft, non tender, no hepatosplenomegaly,obese Pelvic:  External genitalia is normal in appearance, no lesions.  The vagina is pale. Urethra has no lesions or masses. The cervix is smooth, pap with HR HPV genotyping performed.Marland Kitchen  Uterus is felt to be normal size, shape, and contour.  No adnexal masses or tenderness noted.Bladder is non tender, no masses felt. Rectal: Good sphincter tone, no polyps, or hemorrhoids felt.  Hemoccult negative. Extremities/musculoskeletal:  No swelling or varicosities noted, no clubbing or cyanosis Psych:  No mood changes, alert and cooperative,seems happy AA is 0 Fall risk is low    10/28/2022   10:36 AM  Depression screen PHQ 2/9  Decreased Interest 0  Down, Depressed, Hopeless 0  PHQ -  2 Score 0  Altered sleeping 0  Tired, decreased energy 0  Change in appetite 0  Feeling bad or failure about yourself  0  Trouble concentrating 0  Moving slowly or fidgety/restless 0  Suicidal thoughts 0  PHQ-9 Score 0       10/28/2022   10:37 AM  GAD 7 : Generalized Anxiety Score  Nervous, Anxious, on Edge 0  Control/stop worrying 0  Worry too much - different things 0  Trouble relaxing 0  Restless 0  Easily annoyed or irritable 0  Afraid - awful might happen 0  Total GAD 7 Score 0      Upstream - 10/28/22 1033       Pregnancy Intention Screening   Does the patient want to become pregnant in the next year? N/A    Does the patient's partner want to become pregnant in the next year? N/A    Would the patient like to discuss contraceptive options today? N/A      Contraception Wrap Up   Current Method Abstinence   PM   End Method Abstinence   PM   Contraception Counseling Provided No    How was the end contraceptive method provided? N/A             Examination chaperoned by Malachy Mood LPN  Impression and Plan: 1. Routine general medical examination at a health care facility Pap sent Pap in 3 years Physical with PCP next year Labs with PCP Mammogram was negative 12/02/21. Colonoscopy per GI  - Cytology - PAP( [place])  2. Encounter  for screening fecal occult blood testing Hemoccult was negative  - POCT occult blood stool

## 2022-10-30 ENCOUNTER — Other Ambulatory Visit (HOSPITAL_COMMUNITY): Payer: Self-pay | Admitting: Internal Medicine

## 2022-10-30 DIAGNOSIS — Z1231 Encounter for screening mammogram for malignant neoplasm of breast: Secondary | ICD-10-CM

## 2022-10-31 LAB — CYTOLOGY - PAP
Comment: NEGATIVE
Diagnosis: NEGATIVE
High risk HPV: NEGATIVE

## 2022-11-03 ENCOUNTER — Telehealth: Payer: Self-pay | Admitting: *Deleted

## 2022-11-03 NOTE — Telephone Encounter (Signed)
-----   Message from Adline Potter, NP sent at 11/03/2022 12:28 PM EDT ----- Please let her know pap was negative for malignancy and HPV THX

## 2022-11-03 NOTE — Telephone Encounter (Signed)
Pt aware pap was negative for malignancy and HPV. Next pap due in 3 years. Pt voiced understanding. JSY 

## 2022-12-11 ENCOUNTER — Ambulatory Visit (HOSPITAL_COMMUNITY)
Admission: RE | Admit: 2022-12-11 | Discharge: 2022-12-11 | Disposition: A | Discharge status: Home / Self Care | Payer: Medicaid Other | Source: Ambulatory Visit | Attending: Internal Medicine | Admitting: Internal Medicine

## 2022-12-11 DIAGNOSIS — Z1231 Encounter for screening mammogram for malignant neoplasm of breast: Secondary | ICD-10-CM | POA: Insufficient documentation

## 2024-02-03 ENCOUNTER — Other Ambulatory Visit: Payer: Self-pay | Admitting: Nurse Practitioner

## 2024-02-03 DIAGNOSIS — Z1231 Encounter for screening mammogram for malignant neoplasm of breast: Secondary | ICD-10-CM

## 2024-02-10 ENCOUNTER — Ambulatory Visit (HOSPITAL_COMMUNITY)

## 2024-03-10 ENCOUNTER — Other Ambulatory Visit (HOSPITAL_COMMUNITY): Payer: Self-pay | Admitting: Internal Medicine

## 2024-03-10 DIAGNOSIS — Z1231 Encounter for screening mammogram for malignant neoplasm of breast: Secondary | ICD-10-CM

## 2024-03-31 ENCOUNTER — Ambulatory Visit (HOSPITAL_COMMUNITY): Payer: MEDICAID
